# Patient Record
Sex: Female | Born: 1965
Health system: Southern US, Community
[De-identification: ages and names within clinical notes are randomized; demographics above are authoritative.]

## PROBLEM LIST (undated history)

## (undated) DIAGNOSIS — Z86018 Personal history of other benign neoplasm: Secondary | ICD-10-CM

## (undated) DIAGNOSIS — K219 Gastro-esophageal reflux disease without esophagitis: Secondary | ICD-10-CM

## (undated) DIAGNOSIS — Z9889 Other specified postprocedural states: Secondary | ICD-10-CM

## (undated) DIAGNOSIS — R102 Pelvic and perineal pain: Secondary | ICD-10-CM

## (undated) DIAGNOSIS — N83209 Unspecified ovarian cyst, unspecified side: Secondary | ICD-10-CM

## (undated) DIAGNOSIS — R112 Nausea with vomiting, unspecified: Secondary | ICD-10-CM

## (undated) HISTORY — PX: BREAST LUMPECTOMY: SHX2

---

## 1996-11-03 HISTORY — PX: COMBINED HYSTEROSCOPY DIAGNOSTIC / D&C: SUR297

## 1997-11-03 HISTORY — PX: ABDOMINAL HYSTERECTOMY: SHX81

## 1997-12-13 ENCOUNTER — Inpatient Hospital Stay (HOSPITAL_COMMUNITY): Admission: AD | Admit: 1997-12-13 | Discharge: 1997-12-13 | Payer: Self-pay | Admitting: Gynecology

## 1998-01-10 ENCOUNTER — Inpatient Hospital Stay (HOSPITAL_COMMUNITY): Admission: RE | Admit: 1998-01-10 | Discharge: 1998-01-10 | Payer: Self-pay | Admitting: Gynecology

## 1998-01-18 ENCOUNTER — Ambulatory Visit (HOSPITAL_COMMUNITY): Admission: RE | Admit: 1998-01-18 | Discharge: 1998-01-18 | Payer: Self-pay | Admitting: Gynecology

## 1998-02-10 ENCOUNTER — Inpatient Hospital Stay (HOSPITAL_COMMUNITY): Admission: AD | Admit: 1998-02-10 | Discharge: 1998-02-10 | Payer: Self-pay | Admitting: Gynecology

## 1998-03-14 ENCOUNTER — Inpatient Hospital Stay (HOSPITAL_COMMUNITY): Admission: AD | Admit: 1998-03-14 | Discharge: 1998-03-14 | Payer: Self-pay | Admitting: Gynecology

## 2001-08-25 ENCOUNTER — Other Ambulatory Visit: Admission: RE | Admit: 2001-08-25 | Discharge: 2001-08-25 | Payer: Self-pay | Admitting: Gynecology

## 2001-12-09 ENCOUNTER — Encounter: Payer: Self-pay | Admitting: Internal Medicine

## 2001-12-09 ENCOUNTER — Encounter: Admission: RE | Admit: 2001-12-09 | Discharge: 2001-12-09 | Payer: Self-pay | Admitting: Internal Medicine

## 2002-09-05 ENCOUNTER — Other Ambulatory Visit: Admission: RE | Admit: 2002-09-05 | Discharge: 2002-09-05 | Payer: Self-pay | Admitting: Gynecology

## 2004-03-20 ENCOUNTER — Other Ambulatory Visit: Admission: RE | Admit: 2004-03-20 | Discharge: 2004-03-20 | Payer: Self-pay | Admitting: Gynecology

## 2005-05-12 ENCOUNTER — Other Ambulatory Visit: Admission: RE | Admit: 2005-05-12 | Discharge: 2005-05-12 | Payer: Self-pay | Admitting: Gynecology

## 2006-09-03 ENCOUNTER — Other Ambulatory Visit: Admission: RE | Admit: 2006-09-03 | Discharge: 2006-09-03 | Payer: Self-pay | Admitting: Gynecology

## 2008-04-12 ENCOUNTER — Other Ambulatory Visit: Admission: RE | Admit: 2008-04-12 | Discharge: 2008-04-12 | Payer: Self-pay | Admitting: Gynecology

## 2008-06-09 ENCOUNTER — Encounter: Admission: RE | Admit: 2008-06-09 | Discharge: 2008-06-09 | Payer: Self-pay | Admitting: Orthopedic Surgery

## 2009-08-18 ENCOUNTER — Emergency Department (HOSPITAL_COMMUNITY): Admission: EM | Admit: 2009-08-18 | Discharge: 2009-08-18 | Payer: Self-pay | Admitting: Family Medicine

## 2009-08-21 ENCOUNTER — Encounter: Admission: RE | Admit: 2009-08-21 | Discharge: 2009-08-21 | Payer: Self-pay | Admitting: Internal Medicine

## 2009-08-22 IMAGING — RF DG FLUORO GUIDE NDL PLC/BX
2 series · 2 of 2 positions shown · IV contrast (magnevist)
Comparison: none

CLINICAL DATA: Pain

Fluoroscopy Time: 0.22 minutes.
RIGHT HIP INJECTION FOR MRI
TECHNIQUE: Overlying skin prepped with Betadine, draped in the
usual sterile fashion, and infiltrated locally with Lidocaine.
Curved 22 gauge spinal needle advanced to the superolateral margin
of the right femoral head.  1 ml of Lidocaine injected easily.  A
mixture of 0.1 ml Magnevist in 10 ml of dilute Ronlor 60 was then
used to opacify the right femoral head.  No immediate complication.

[Series 1: (hospital) · 1 of 1 slices shown (1 of 2)]
[im 1/1]
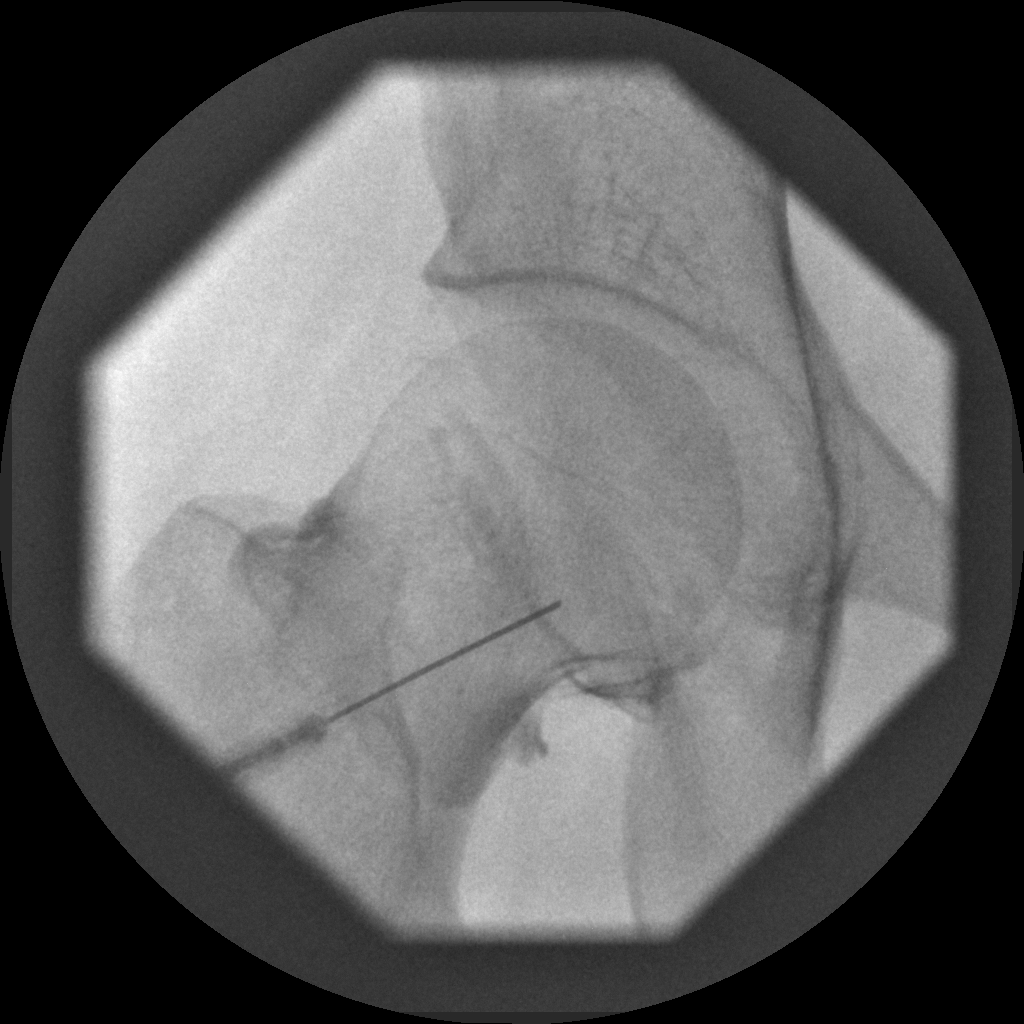

[Series 2: (hospital) · 1 of 1 slices shown (2 of 2)]
[im 1/1]
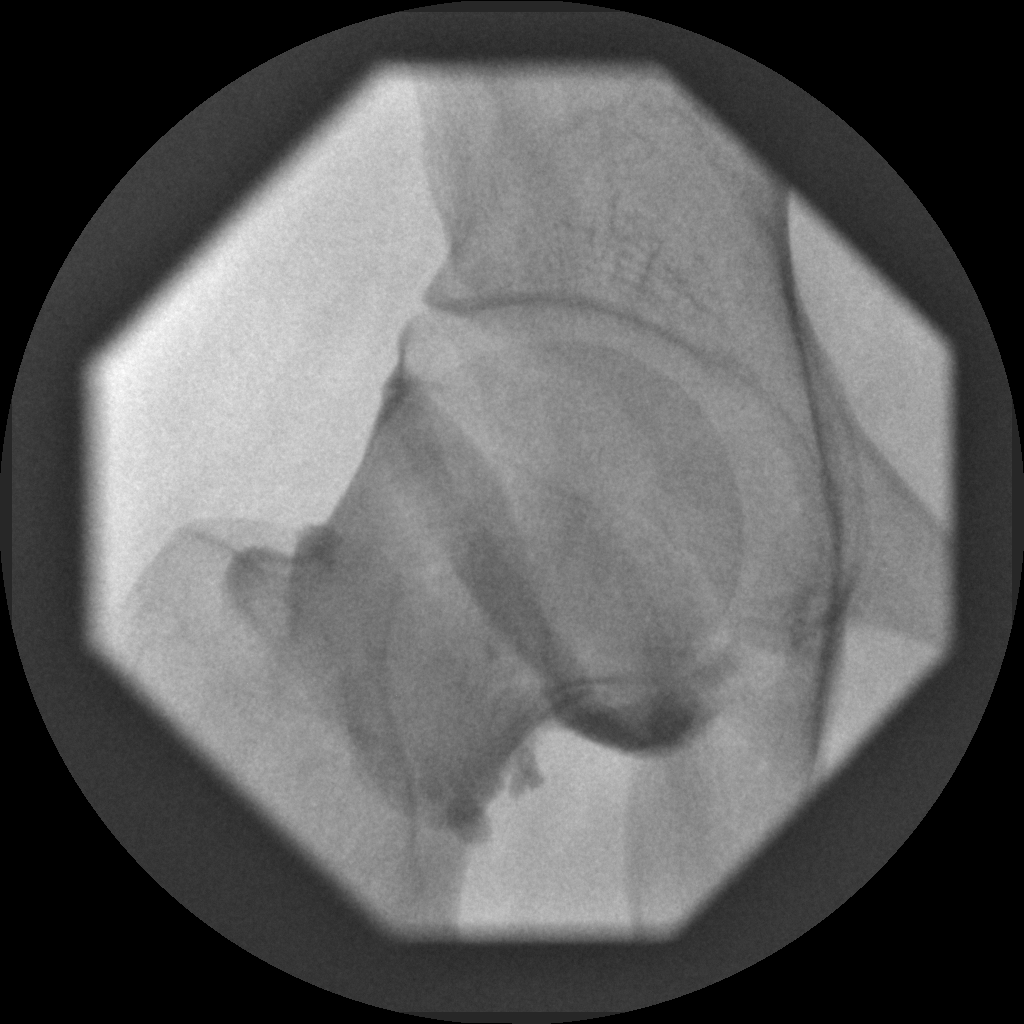

[2 of 2 positions shown; findings below may reference images not displayed]

IMPRESSION: Technically successful right hip injection for MRI.

## 2011-07-31 ENCOUNTER — Ambulatory Visit (HOSPITAL_BASED_OUTPATIENT_CLINIC_OR_DEPARTMENT_OTHER)
Admission: RE | Admit: 2011-07-31 | Discharge: 2011-07-31 | Disposition: A | Discharge status: Home / Self Care | Payer: BC Managed Care – PPO | Source: Ambulatory Visit | Attending: Podiatry | Admitting: Podiatry

## 2011-07-31 DIAGNOSIS — Z01812 Encounter for preprocedural laboratory examination: Secondary | ICD-10-CM | POA: Insufficient documentation

## 2011-07-31 DIAGNOSIS — M722 Plantar fascial fibromatosis: Secondary | ICD-10-CM | POA: Insufficient documentation

## 2011-07-31 LAB — POCT HEMOGLOBIN-HEMACUE: Hemoglobin: 12.4 g/dL (ref 12.0–15.0)

## 2012-01-06 ENCOUNTER — Other Ambulatory Visit: Payer: Self-pay | Admitting: Internal Medicine

## 2012-01-06 DIAGNOSIS — E079 Disorder of thyroid, unspecified: Secondary | ICD-10-CM

## 2012-02-03 ENCOUNTER — Other Ambulatory Visit: Payer: BC Managed Care – PPO

## 2013-08-05 ENCOUNTER — Other Ambulatory Visit: Payer: Self-pay | Admitting: Gynecology

## 2013-08-05 DIAGNOSIS — R922 Inconclusive mammogram: Secondary | ICD-10-CM

## 2013-10-14 ENCOUNTER — Other Ambulatory Visit: Payer: Self-pay | Admitting: Gynecology

## 2013-10-14 DIAGNOSIS — N6320 Unspecified lump in the left breast, unspecified quadrant: Secondary | ICD-10-CM

## 2013-10-24 ENCOUNTER — Other Ambulatory Visit: Payer: BC Managed Care – PPO

## 2016-05-12 DIAGNOSIS — Z1231 Encounter for screening mammogram for malignant neoplasm of breast: Secondary | ICD-10-CM | POA: Diagnosis not present

## 2016-05-12 DIAGNOSIS — Z803 Family history of malignant neoplasm of breast: Secondary | ICD-10-CM | POA: Diagnosis not present

## 2016-06-19 DIAGNOSIS — Z01419 Encounter for gynecological examination (general) (routine) without abnormal findings: Secondary | ICD-10-CM | POA: Diagnosis not present

## 2016-06-19 DIAGNOSIS — Z6833 Body mass index (BMI) 33.0-33.9, adult: Secondary | ICD-10-CM | POA: Diagnosis not present

## 2016-08-18 DIAGNOSIS — E559 Vitamin D deficiency, unspecified: Secondary | ICD-10-CM | POA: Diagnosis not present

## 2016-08-18 DIAGNOSIS — E7800 Pure hypercholesterolemia, unspecified: Secondary | ICD-10-CM | POA: Diagnosis not present

## 2016-08-18 DIAGNOSIS — Z0000 Encounter for general adult medical examination without abnormal findings: Secondary | ICD-10-CM | POA: Diagnosis not present

## 2016-09-09 DIAGNOSIS — Z0000 Encounter for general adult medical examination without abnormal findings: Secondary | ICD-10-CM | POA: Diagnosis not present

## 2016-09-09 DIAGNOSIS — Z23 Encounter for immunization: Secondary | ICD-10-CM | POA: Diagnosis not present

## 2016-09-09 DIAGNOSIS — K140 Glossitis: Secondary | ICD-10-CM | POA: Diagnosis not present

## 2016-09-09 DIAGNOSIS — Z833 Family history of diabetes mellitus: Secondary | ICD-10-CM | POA: Diagnosis not present

## 2016-09-10 ENCOUNTER — Other Ambulatory Visit: Payer: Self-pay | Admitting: Internal Medicine

## 2016-09-10 DIAGNOSIS — E010 Iodine-deficiency related diffuse (endemic) goiter: Secondary | ICD-10-CM

## 2016-09-15 DIAGNOSIS — D225 Melanocytic nevi of trunk: Secondary | ICD-10-CM | POA: Diagnosis not present

## 2016-09-15 DIAGNOSIS — L918 Other hypertrophic disorders of the skin: Secondary | ICD-10-CM | POA: Diagnosis not present

## 2016-09-15 DIAGNOSIS — D485 Neoplasm of uncertain behavior of skin: Secondary | ICD-10-CM | POA: Diagnosis not present

## 2016-09-15 DIAGNOSIS — D1801 Hemangioma of skin and subcutaneous tissue: Secondary | ICD-10-CM | POA: Diagnosis not present

## 2016-09-15 DIAGNOSIS — L821 Other seborrheic keratosis: Secondary | ICD-10-CM | POA: Diagnosis not present

## 2016-10-09 ENCOUNTER — Encounter: Payer: Self-pay | Admitting: Internal Medicine

## 2016-11-24 ENCOUNTER — Other Ambulatory Visit: Payer: Self-pay

## 2016-11-28 ENCOUNTER — Ambulatory Visit
Admission: RE | Admit: 2016-11-28 | Discharge: 2016-11-28 | Disposition: A | Discharge status: Home / Self Care | Payer: BLUE CROSS/BLUE SHIELD | Source: Ambulatory Visit | Attending: Internal Medicine | Admitting: Internal Medicine

## 2016-11-28 DIAGNOSIS — E079 Disorder of thyroid, unspecified: Secondary | ICD-10-CM | POA: Diagnosis not present

## 2016-11-28 DIAGNOSIS — E010 Iodine-deficiency related diffuse (endemic) goiter: Secondary | ICD-10-CM

## 2017-03-04 DIAGNOSIS — N6310 Unspecified lump in the right breast, unspecified quadrant: Secondary | ICD-10-CM | POA: Diagnosis not present

## 2017-03-05 DIAGNOSIS — N6310 Unspecified lump in the right breast, unspecified quadrant: Secondary | ICD-10-CM | POA: Diagnosis not present

## 2017-03-17 DIAGNOSIS — R05 Cough: Secondary | ICD-10-CM | POA: Diagnosis not present

## 2017-04-02 DIAGNOSIS — N951 Menopausal and female climacteric states: Secondary | ICD-10-CM | POA: Diagnosis not present

## 2017-04-02 DIAGNOSIS — N6310 Unspecified lump in the right breast, unspecified quadrant: Secondary | ICD-10-CM | POA: Diagnosis not present

## 2017-04-30 DIAGNOSIS — R05 Cough: Secondary | ICD-10-CM | POA: Diagnosis not present

## 2017-08-21 DIAGNOSIS — M25561 Pain in right knee: Secondary | ICD-10-CM | POA: Diagnosis not present

## 2017-09-15 DIAGNOSIS — Z0000 Encounter for general adult medical examination without abnormal findings: Secondary | ICD-10-CM | POA: Diagnosis not present

## 2017-09-21 DIAGNOSIS — D1801 Hemangioma of skin and subcutaneous tissue: Secondary | ICD-10-CM | POA: Diagnosis not present

## 2017-09-21 DIAGNOSIS — L919 Hypertrophic disorder of the skin, unspecified: Secondary | ICD-10-CM | POA: Diagnosis not present

## 2017-09-21 DIAGNOSIS — L820 Inflamed seborrheic keratosis: Secondary | ICD-10-CM | POA: Diagnosis not present

## 2017-09-21 DIAGNOSIS — D2339 Other benign neoplasm of skin of other parts of face: Secondary | ICD-10-CM | POA: Diagnosis not present

## 2017-09-21 DIAGNOSIS — D225 Melanocytic nevi of trunk: Secondary | ICD-10-CM | POA: Diagnosis not present

## 2017-09-22 DIAGNOSIS — K219 Gastro-esophageal reflux disease without esophagitis: Secondary | ICD-10-CM | POA: Diagnosis not present

## 2017-09-22 DIAGNOSIS — Z0000 Encounter for general adult medical examination without abnormal findings: Secondary | ICD-10-CM | POA: Diagnosis not present

## 2017-09-22 DIAGNOSIS — F429 Obsessive-compulsive disorder, unspecified: Secondary | ICD-10-CM | POA: Diagnosis not present

## 2017-10-05 DIAGNOSIS — Z01419 Encounter for gynecological examination (general) (routine) without abnormal findings: Secondary | ICD-10-CM | POA: Diagnosis not present

## 2017-10-05 DIAGNOSIS — Z6831 Body mass index (BMI) 31.0-31.9, adult: Secondary | ICD-10-CM | POA: Diagnosis not present

## 2017-10-05 DIAGNOSIS — Z13820 Encounter for screening for osteoporosis: Secondary | ICD-10-CM | POA: Diagnosis not present

## 2017-12-22 DIAGNOSIS — R1032 Left lower quadrant pain: Secondary | ICD-10-CM | POA: Diagnosis not present

## 2017-12-22 DIAGNOSIS — R102 Pelvic and perineal pain: Secondary | ICD-10-CM | POA: Diagnosis not present

## 2017-12-25 DIAGNOSIS — R102 Pelvic and perineal pain: Secondary | ICD-10-CM | POA: Diagnosis not present

## 2017-12-25 DIAGNOSIS — N83202 Unspecified ovarian cyst, left side: Secondary | ICD-10-CM | POA: Diagnosis not present

## 2017-12-25 DIAGNOSIS — N9489 Other specified conditions associated with female genital organs and menstrual cycle: Secondary | ICD-10-CM | POA: Diagnosis not present

## 2018-01-01 DIAGNOSIS — N83209 Unspecified ovarian cyst, unspecified side: Secondary | ICD-10-CM | POA: Diagnosis not present

## 2018-02-05 DIAGNOSIS — N83202 Unspecified ovarian cyst, left side: Secondary | ICD-10-CM | POA: Diagnosis not present

## 2018-03-16 DIAGNOSIS — N83299 Other ovarian cyst, unspecified side: Secondary | ICD-10-CM | POA: Diagnosis not present

## 2018-03-16 DIAGNOSIS — R1032 Left lower quadrant pain: Secondary | ICD-10-CM | POA: Diagnosis not present

## 2018-04-19 DIAGNOSIS — D2372 Other benign neoplasm of skin of left lower limb, including hip: Secondary | ICD-10-CM | POA: Diagnosis not present

## 2018-04-19 DIAGNOSIS — B070 Plantar wart: Secondary | ICD-10-CM | POA: Diagnosis not present

## 2018-04-19 DIAGNOSIS — L821 Other seborrheic keratosis: Secondary | ICD-10-CM | POA: Diagnosis not present

## 2018-05-21 ENCOUNTER — Encounter (HOSPITAL_BASED_OUTPATIENT_CLINIC_OR_DEPARTMENT_OTHER): Payer: Self-pay | Admitting: *Deleted

## 2018-05-21 NOTE — Progress Notes (Signed)
Called and lvm via phone w/ heather, or scheduler for dr leger. Asked her to have dr leger re-do her informed consent due to abbreviation's the she wrote for procedure.

## 2018-05-24 ENCOUNTER — Encounter (HOSPITAL_BASED_OUTPATIENT_CLINIC_OR_DEPARTMENT_OTHER): Payer: Self-pay | Admitting: *Deleted

## 2018-05-24 ENCOUNTER — Other Ambulatory Visit: Payer: Self-pay

## 2018-05-24 DIAGNOSIS — N83202 Unspecified ovarian cyst, left side: Secondary | ICD-10-CM | POA: Diagnosis not present

## 2018-05-24 DIAGNOSIS — R1032 Left lower quadrant pain: Secondary | ICD-10-CM | POA: Diagnosis not present

## 2018-05-24 NOTE — Progress Notes (Signed)
Spoke w/ pt via phone for pre-op interview.  Npo after mn.  Arrive at 0530. Needs cbc and t&s.

## 2018-05-24 NOTE — H&P (Addendum)
Destiny Baker is an 52 y.o. female. Presenting for scheduled LSC BSO s/s L ovarian cyst. Cyst measures 5cm and has been stable in size for several months. CA125 has been WNL.   Pertinent Gynecological History: Menses: post-menopausal Bleeding: na Contraception: status post hysterectomy DES exposure: denies Blood transfusions: none Sexually transmitted diseases: no past history Previous GYN Procedures: na  Last mammogram: normal Date: 2018 Last pap: normal OB History: G0   Menstrual History: No LMP recorded. Patient has had a hysterectomy.    Past Medical History:  Diagnosis Date  . GERD (gastroesophageal reflux disease)   . History of uterine fibroid   . Ovarian cyst   . Pelvic pain   . PONV (postoperative nausea and vomiting)   depression - fluoxetine QD  Past Surgical History:  Procedure Laterality Date  . ABDOMINAL HYSTERECTOMY  1999  . BREAST LUMPECTOMY Left 1990s   benign  . COMBINED HYSTEROSCOPY DIAGNOSTIC / D&C  1998    History reviewed. No pertinent family history.  Social History:  reports that she has never smoked. She has never used smokeless tobacco. She reports that she does not drink alcohol or use drugs.  Allergies: No Known Allergies  No medications prior to admission.    ROS  Height 5\' 6"  (1.676 m), weight 95.3 kg (210 lb). Physical Exam  Gen: well appearing, NAD CV: Reg rate Pulm: NWOB Abd: soft, nondistended, nontender, no masses GYN: uterus absent, no adnexa ttp/CMT Ext: No edema b/l   No results found for this or any previous visit (from the past 24 hour(s)).  No results found.  Assessment/Plan: 52 yo here for [facility] BSO s/s L ovarian cyst measuring 5cm. She has intermittent LLQ pain. The cyst has been stable in size for several months. CA125 has been WNL. However, patient requests removal. Risks reviewed in office - infxn, bleeding, damage to surrounding structures, and need for additional procedures. If unable to removal L ovary LSC,  would plan mini-lap. If unable to removal R ovary laproscopically, prefers that it stay. Otherwise, if able to do [facility], prefers removal of both ovaries and tubes if possible.   Tyson Dense 05/24/2018, 1:56 PM   No updates to above H&P. Patient arrived NPO and was consented in PACU. Risks discussed including infection, bleeding, damage to surrounding structures, need for additional procedures, and above discussion. All questions answered. Consent signed. Proceed with above surgery.   Lucillie Garfinkel MD

## 2018-05-25 ENCOUNTER — Encounter (HOSPITAL_BASED_OUTPATIENT_CLINIC_OR_DEPARTMENT_OTHER): Payer: Self-pay | Admitting: Anesthesiology

## 2018-05-25 NOTE — Anesthesia Preprocedure Evaluation (Addendum)
Anesthesia Evaluation  Patient identified by MRN, date of birth, ID band Patient awake    Reviewed: Allergy & Precautions, NPO status , Patient's Chart, lab work & pertinent test results  History of Anesthesia Complications (+) PONV and history of anesthetic complications  Airway Mallampati: II  TM Distance: >3 FB Neck ROM: Full    Dental  (+) Teeth Intact   Pulmonary neg pulmonary ROS,    Pulmonary exam normal breath sounds clear to auscultation       Cardiovascular negative cardio ROS Normal cardiovascular exam Rhythm:Regular Rate:Normal     Neuro/Psych Depression negative neurological ROS     GI/Hepatic Neg liver ROS, GERD  Medicated and Controlled,  Endo/Other  Obesity  Renal/GU negative Renal ROS  negative genitourinary   Musculoskeletal   Abdominal (+) + obese,   Peds  Hematology negative hematology ROS (+)   Anesthesia Other Findings   Reproductive/Obstetrics LLQ pelvic pain Left ovarian cysts Uterine fibroids                            Anesthesia Physical Anesthesia Plan  ASA: II  Anesthesia Plan: General   Post-op Pain Management:    Induction: Intravenous  PONV Risk Score and Plan: 4 or greater and Scopolamine patch - Pre-op, Midazolam, Dexamethasone, Ondansetron and Treatment may vary due to age or medical condition  Airway Management Planned: Oral ETT  Additional Equipment:   Intra-op Plan:   Post-operative Plan: Extubation in OR  Informed Consent: I have reviewed the patients History and Physical, chart, labs and discussed the procedure including the risks, benefits and alternatives for the proposed anesthesia with the patient or authorized representative who has indicated his/her understanding and acceptance.   Dental advisory given  Plan Discussed with: CRNA and Surgeon  Anesthesia Plan Comments:        Anesthesia Quick Evaluation

## 2018-05-26 ENCOUNTER — Encounter (HOSPITAL_BASED_OUTPATIENT_CLINIC_OR_DEPARTMENT_OTHER)
Admission: RE | Disposition: A | Discharge status: Home / Self Care | Payer: Self-pay | Source: Ambulatory Visit | Attending: Obstetrics and Gynecology

## 2018-05-26 ENCOUNTER — Ambulatory Visit (HOSPITAL_BASED_OUTPATIENT_CLINIC_OR_DEPARTMENT_OTHER): Payer: BLUE CROSS/BLUE SHIELD | Admitting: Certified Registered"

## 2018-05-26 ENCOUNTER — Ambulatory Visit (HOSPITAL_COMMUNITY)
Admission: RE | Admit: 2018-05-26 | Discharge: 2018-05-26 | Disposition: A | Discharge status: Home / Self Care | Payer: BLUE CROSS/BLUE SHIELD | Source: Ambulatory Visit | Attending: Obstetrics and Gynecology | Admitting: Obstetrics and Gynecology

## 2018-05-26 ENCOUNTER — Encounter (HOSPITAL_BASED_OUTPATIENT_CLINIC_OR_DEPARTMENT_OTHER): Payer: Self-pay | Admitting: *Deleted

## 2018-05-26 DIAGNOSIS — D279 Benign neoplasm of unspecified ovary: Secondary | ICD-10-CM | POA: Diagnosis not present

## 2018-05-26 DIAGNOSIS — F329 Major depressive disorder, single episode, unspecified: Secondary | ICD-10-CM | POA: Diagnosis not present

## 2018-05-26 DIAGNOSIS — Z79899 Other long term (current) drug therapy: Secondary | ICD-10-CM | POA: Insufficient documentation

## 2018-05-26 DIAGNOSIS — K219 Gastro-esophageal reflux disease without esophagitis: Secondary | ICD-10-CM | POA: Insufficient documentation

## 2018-05-26 DIAGNOSIS — N83201 Unspecified ovarian cyst, right side: Secondary | ICD-10-CM | POA: Diagnosis not present

## 2018-05-26 DIAGNOSIS — D271 Benign neoplasm of left ovary: Secondary | ICD-10-CM | POA: Diagnosis not present

## 2018-05-26 DIAGNOSIS — N83202 Unspecified ovarian cyst, left side: Secondary | ICD-10-CM | POA: Diagnosis not present

## 2018-05-26 HISTORY — PX: LAPAROSCOPIC BILATERAL SALPINGO OOPHERECTOMY: SHX5890

## 2018-05-26 HISTORY — DX: Pelvic and perineal pain: R10.2

## 2018-05-26 HISTORY — DX: Nausea with vomiting, unspecified: R11.2

## 2018-05-26 HISTORY — DX: Nausea with vomiting, unspecified: Z98.890

## 2018-05-26 HISTORY — DX: Personal history of other benign neoplasm: Z86.018

## 2018-05-26 HISTORY — DX: Gastro-esophageal reflux disease without esophagitis: K21.9

## 2018-05-26 HISTORY — DX: Unspecified ovarian cyst, unspecified side: N83.209

## 2018-05-26 LAB — CBC
HCT: 43.7 % (ref 36.0–46.0)
Hemoglobin: 14.6 g/dL (ref 12.0–15.0)
MCH: 31 pg (ref 26.0–34.0)
MCHC: 33.4 g/dL (ref 30.0–36.0)
MCV: 92.8 fL (ref 78.0–100.0)
Platelets: 259 10*3/uL (ref 150–400)
RBC: 4.71 MIL/uL (ref 3.87–5.11)
RDW: 14.4 % (ref 11.5–15.5)
WBC: 8.8 10*3/uL (ref 4.0–10.5)

## 2018-05-26 LAB — TYPE AND SCREEN
ABO/RH(D): A POS
Antibody Screen: NEGATIVE

## 2018-05-26 LAB — ABO/RH: ABO/RH(D): A POS

## 2018-05-26 SURGERY — SALPINGO-OOPHORECTOMY, BILATERAL, LAPAROSCOPIC
Anesthesia: General | Laterality: Bilateral

## 2018-05-26 MED ORDER — LACTATED RINGERS IV SOLN
INTRAVENOUS | Status: DC
  Administered 2018-05-26 (×2): via INTRAVENOUS
  Filled 2018-05-26: qty 1000

## 2018-05-26 MED ORDER — PROPOFOL 10 MG/ML IV BOLUS
INTRAVENOUS | Status: AC
  Filled 2018-05-26: qty 20

## 2018-05-26 MED ORDER — KETOROLAC TROMETHAMINE 30 MG/ML IJ SOLN
INTRAMUSCULAR | Status: AC
  Filled 2018-05-26: qty 1

## 2018-05-26 MED ORDER — EPHEDRINE SULFATE-NACL 50-0.9 MG/10ML-% IV SOSY
PREFILLED_SYRINGE | INTRAVENOUS | Status: DC | PRN
  Administered 2018-05-26: 5 mg via INTRAVENOUS
  Administered 2018-05-26: 10 mg via INTRAVENOUS

## 2018-05-26 MED ORDER — FENTANYL CITRATE (PF) 250 MCG/5ML IJ SOLN
INTRAMUSCULAR | Status: AC
  Filled 2018-05-26: qty 5

## 2018-05-26 MED ORDER — DEXAMETHASONE SODIUM PHOSPHATE 10 MG/ML IJ SOLN
INTRAMUSCULAR | Status: AC
  Filled 2018-05-26: qty 1

## 2018-05-26 MED ORDER — ONDANSETRON HCL 4 MG/2ML IJ SOLN
INTRAMUSCULAR | Status: DC | PRN
  Administered 2018-05-26 (×2): 4 mg via INTRAVENOUS

## 2018-05-26 MED ORDER — ROCURONIUM BROMIDE 100 MG/10ML IV SOLN
INTRAVENOUS | Status: DC | PRN
  Administered 2018-05-26: 40 mg via INTRAVENOUS
  Administered 2018-05-26: 10 mg via INTRAVENOUS

## 2018-05-26 MED ORDER — LIDOCAINE 2% (20 MG/ML) 5 ML SYRINGE
INTRAMUSCULAR | Status: DC | PRN
  Administered 2018-05-26: 80 mg via INTRAVENOUS

## 2018-05-26 MED ORDER — HYDROCODONE-ACETAMINOPHEN 7.5-325 MG PO TABS
ORAL_TABLET | ORAL | Status: AC
  Filled 2018-05-26: qty 1

## 2018-05-26 MED ORDER — MIDAZOLAM HCL 5 MG/5ML IJ SOLN
INTRAMUSCULAR | Status: DC | PRN
  Administered 2018-05-26: 2 mg via INTRAVENOUS

## 2018-05-26 MED ORDER — METOCLOPRAMIDE HCL 5 MG/ML IJ SOLN
10.0000 mg | Freq: Once | INTRAMUSCULAR | Status: AC | PRN
  Administered 2018-05-26: 10 mg via INTRAVENOUS
  Filled 2018-05-26: qty 2

## 2018-05-26 MED ORDER — SUGAMMADEX SODIUM 200 MG/2ML IV SOLN
INTRAVENOUS | Status: DC | PRN
  Administered 2018-05-26: 150 mg via INTRAVENOUS

## 2018-05-26 MED ORDER — SODIUM CHLORIDE 0.9 % IR SOLN
Status: DC | PRN
  Administered 2018-05-26: 1000 mL

## 2018-05-26 MED ORDER — MEPERIDINE HCL 25 MG/ML IJ SOLN
6.2500 mg | INTRAMUSCULAR | Status: DC | PRN
  Filled 2018-05-26: qty 1

## 2018-05-26 MED ORDER — FENTANYL CITRATE (PF) 100 MCG/2ML IJ SOLN
INTRAMUSCULAR | Status: DC | PRN
  Administered 2018-05-26 (×3): 50 ug via INTRAVENOUS

## 2018-05-26 MED ORDER — METOCLOPRAMIDE HCL 5 MG/ML IJ SOLN
INTRAMUSCULAR | Status: AC
  Filled 2018-05-26: qty 2

## 2018-05-26 MED ORDER — HYDROCODONE-ACETAMINOPHEN 7.5-325 MG PO TABS
1.0000 | ORAL_TABLET | Freq: Once | ORAL | Status: AC | PRN
  Administered 2018-05-26: 1 via ORAL
  Filled 2018-05-26: qty 1

## 2018-05-26 MED ORDER — LIDOCAINE 2% (20 MG/ML) 5 ML SYRINGE
INTRAMUSCULAR | Status: AC
  Filled 2018-05-26: qty 5

## 2018-05-26 MED ORDER — ROCURONIUM BROMIDE 100 MG/10ML IV SOLN
INTRAVENOUS | Status: AC
  Filled 2018-05-26: qty 1

## 2018-05-26 MED ORDER — HYDROMORPHONE HCL 1 MG/ML IJ SOLN
0.2500 mg | INTRAMUSCULAR | Status: DC | PRN
  Filled 2018-05-26: qty 0.5

## 2018-05-26 MED ORDER — SCOPOLAMINE 1 MG/3DAYS TD PT72
MEDICATED_PATCH | TRANSDERMAL | Status: AC
  Filled 2018-05-26: qty 1

## 2018-05-26 MED ORDER — ONDANSETRON HCL 4 MG/2ML IJ SOLN
INTRAMUSCULAR | Status: AC
  Filled 2018-05-26: qty 4

## 2018-05-26 MED ORDER — SCOPOLAMINE 1 MG/3DAYS TD PT72
1.0000 | MEDICATED_PATCH | TRANSDERMAL | Status: DC
  Administered 2018-05-26: 1.5 mg via TRANSDERMAL
  Filled 2018-05-26: qty 1

## 2018-05-26 MED ORDER — KETOROLAC TROMETHAMINE 30 MG/ML IJ SOLN
INTRAMUSCULAR | Status: DC | PRN
  Administered 2018-05-26: 30 mg via INTRAVENOUS

## 2018-05-26 MED ORDER — DEXAMETHASONE SODIUM PHOSPHATE 10 MG/ML IJ SOLN
INTRAMUSCULAR | Status: DC | PRN
  Administered 2018-05-26: 10 mg via INTRAVENOUS

## 2018-05-26 MED ORDER — DIPHENHYDRAMINE HCL 50 MG/ML IJ SOLN
INTRAMUSCULAR | Status: AC
  Filled 2018-05-26: qty 1

## 2018-05-26 MED ORDER — PROPOFOL 10 MG/ML IV BOLUS
INTRAVENOUS | Status: DC | PRN
  Administered 2018-05-26: 30 mg via INTRAVENOUS
  Administered 2018-05-26: 170 mg via INTRAVENOUS

## 2018-05-26 MED ORDER — MIDAZOLAM HCL 2 MG/2ML IJ SOLN
INTRAMUSCULAR | Status: AC
  Filled 2018-05-26: qty 2

## 2018-05-26 MED ORDER — DIPHENHYDRAMINE HCL 50 MG/ML IJ SOLN
12.5000 mg | Freq: Once | INTRAMUSCULAR | Status: AC
  Administered 2018-05-26: 12.5 mg via INTRAVENOUS
  Filled 2018-05-26: qty 0.25

## 2018-05-26 MED ORDER — BUPIVACAINE HCL (PF) 0.25 % IJ SOLN
INTRAMUSCULAR | Status: DC | PRN
  Administered 2018-05-26: 7 mL

## 2018-05-26 SURGICAL SUPPLY — 37 items
ADH SKN CLS APL DERMABOND .7 (GAUZE/BANDAGES/DRESSINGS) ×1
CABLE HIGH FREQUENCY MONO STRZ (ELECTRODE) ×1 IMPLANT
CATH ROBINSON RED A/P 16FR (CATHETERS) ×2 IMPLANT
COVER MAYO STAND STRL (DRAPES) ×2 IMPLANT
DERMABOND ADVANCED (GAUZE/BANDAGES/DRESSINGS) ×1
DERMABOND ADVANCED .7 DNX12 (GAUZE/BANDAGES/DRESSINGS) ×1 IMPLANT
DRSG OPSITE POSTOP 3X4 (GAUZE/BANDAGES/DRESSINGS) IMPLANT
DURAPREP 26ML APPLICATOR (WOUND CARE) ×2 IMPLANT
GLOVE BIO SURGEON STRL SZ 6.5 (GLOVE) ×4 IMPLANT
GLOVE BIOGEL PI IND STRL 6.5 (GLOVE) ×1 IMPLANT
GLOVE BIOGEL PI INDICATOR 6.5 (GLOVE) ×1
GOWN STRL REUS W/TWL LRG LVL3 (GOWN DISPOSABLE) ×5 IMPLANT
IV NS 1000ML (IV SOLUTION)
IV NS 1000ML BAXH (IV SOLUTION) IMPLANT
LIGASURE BLUNT 5MM 37CM (INSTRUMENTS) ×1 IMPLANT
NDL HYPO 25X1 1.5 SAFETY (NEEDLE) ×1 IMPLANT
NDL INSUFFLATION 14GA 150MM (NEEDLE) IMPLANT
NEEDLE HYPO 25X1 1.5 SAFETY (NEEDLE) ×2 IMPLANT
NEEDLE INSUFFLATION 120MM (ENDOMECHANICALS) ×2 IMPLANT
NEEDLE INSUFFLATION 14GA 150MM (NEEDLE) ×2 IMPLANT
NS IRRIG 500ML POUR BTL (IV SOLUTION) ×2 IMPLANT
PACK LAPAROSCOPY BASIN (CUSTOM PROCEDURE TRAY) ×2 IMPLANT
POUCH LAPAROSCOPIC INSTRUMENT (MISCELLANEOUS) ×1 IMPLANT
SCISSORS LAP 5X35 DISP (ENDOMECHANICALS) IMPLANT
SCISSORS LAP 5X45 EPIX DISP (ENDOMECHANICALS) IMPLANT
SET IRRIG TUBING LAPAROSCOPIC (IRRIGATION / IRRIGATOR) ×1 IMPLANT
SUT VICRYL 0 UR6 27IN ABS (SUTURE) ×2 IMPLANT
SUT VICRYL RAPIDE 4/0 PS 2 (SUTURE) ×4 IMPLANT
SYR 50ML LL SCALE MARK (SYRINGE) IMPLANT
SYS BAG RETRIEVAL 10MM (BASKET) ×2
SYSTEM BAG RETRIEVAL 10MM (BASKET) IMPLANT
TOWEL OR 17X24 6PK STRL BLUE (TOWEL DISPOSABLE) ×4 IMPLANT
TROCAR XCEL NON-BLD 11X100MML (ENDOMECHANICALS) IMPLANT
TROCAR XCEL NON-BLD 5MMX100MML (ENDOMECHANICALS) ×3 IMPLANT
TUBING INSUF HEATED (TUBING) ×2 IMPLANT
WARMER LAPAROSCOPE (MISCELLANEOUS) ×2 IMPLANT
WATER STERILE IRR 500ML POUR (IV SOLUTION) ×2 IMPLANT

## 2018-05-26 NOTE — Discharge Instructions (Signed)
°Post Anesthesia Home Care Instructions ° °Activity: °Get plenty of rest for the remainder of the day. A responsible individual must stay with you for 24 hours following the procedure.  °For the next 24 hours, DO NOT: °-Drive a car °-Operate machinery °-Drink alcoholic beverages °-Take any medication unless instructed by your physician °-Make any legal decisions or sign important papers. ° °Meals: °Start with liquid foods such as gelatin or soup. Progress to regular foods as tolerated. Avoid greasy, spicy, heavy foods. If nausea and/or vomiting occur, drink only clear liquids until the nausea and/or vomiting subsides. Call your physician if vomiting continues. ° °Special Instructions/Symptoms: °Your throat may feel dry or sore from the anesthesia or the breathing tube placed in your throat during surgery. If this causes discomfort, gargle with warm salt water. The discomfort should disappear within 24 hours. ° °If you had a scopolamine patch placed behind your ear for the management of post- operative nausea and/or vomiting: ° °1. The medication in the patch is effective for 72 hours, after which it should be removed.  Wrap patch in a tissue and discard in the trash. Wash hands thoroughly with soap and water. °2. You may remove the patch earlier than 72 hours if you experience unpleasant side effects which may include dry mouth, dizziness or visual disturbances. °3. Avoid touching the patch. Wash your hands with soap and water after contact with the patch. °  °DISCHARGE INSTRUCTIONS: Laparoscopy ° °The following instructions have been prepared to help you care for yourself upon your return home today. ° °Wound care: °• Do not get the incision wet for the first 24 hours. The incision should be kept clean and dry. °• The Band-Aids or dressings may be removed the day after surgery. °• Should the incision become sore, red, and swollen after the first week, check with your doctor. ° °Personal hygiene: °• Shower the day  after your procedure. ° °Activity and limitations: °• Do NOT drive or operate any equipment today. °• Do NOT lift anything more than 15 pounds for 2-3 weeks after surgery. °• Do NOT rest in bed all day. °• Walking is encouraged. Walk each day, starting slowly with 5-minute walks 3 or 4 times a day. Slowly increase the length of your walks. °• Walk up and down stairs slowly. °• Do NOT do strenuous activities, such as golfing, playing tennis, bowling, running, biking, weight lifting, gardening, mowing, or vacuuming for 2-4 weeks. Ask your doctor when it is okay to start. ° °Diet: Eat a light meal as desired this evening. You may resume your usual diet tomorrow. ° °Return to work: This is dependent on the type of work you do. For the most part you can return to a desk job within a week of surgery. If you are more active at work, please discuss this with your doctor. ° °What to expect after your surgery: You may have a slight burning sensation when you urinate on the first day. You may have a very small amount of blood in the urine. Expect to have a small amount of vaginal discharge/light bleeding for 1-2 weeks. It is not unusual to have abdominal soreness and bruising for up to 2 weeks. You may be tired and need more rest for about 1 week. You may experience shoulder pain for 24-72 hours. Lying flat in bed may relieve it. ° °Call your doctor for any of the following: °• Develop a fever of 100.4 or greater °• Inability to urinate 6 hours after discharge   from hospital  Severe pain not relieved by pain medications  Persistent of heavy bleeding at incision site  Redness or swelling around incision site after a week  Increasing nausea or vomiting  Do not take any nonsteroidal anti inflammatories (Ibuprofen, Advil, Motrin, aleve) until after 3:00 pm today.

## 2018-05-26 NOTE — Transfer of Care (Signed)
Immediate Anesthesia Transfer of Care Note  Patient: Destiny Baker  Procedure(s) Performed: LAPAROSCOPIC BILATERAL SALPINGO OOPHORECTOMY (Bilateral )  Patient Location: PACU  Anesthesia Type:General  Level of Consciousness: awake and patient cooperative  Airway & Oxygen Therapy: Patient Spontanous Breathing and Patient connected to nasal cannula oxygen  Post-op Assessment: Report given to RN and Post -op Vital signs reviewed and stable  Post vital signs: Reviewed and stable  Last Vitals:  Vitals Value Taken Time  BP 125/75 05/26/2018  9:27 AM  Temp    Pulse 78 05/26/2018  9:29 AM  Resp 19 05/26/2018  9:29 AM  SpO2 93 % 05/26/2018  9:29 AM  Vitals shown include unvalidated device data.  Last Pain:  Vitals:   05/26/18 0531  TempSrc: Oral         Complications: No apparent anesthesia complications

## 2018-05-26 NOTE — Op Note (Signed)
  PREOPERATIVE DIAGNOSES: 1.Left ovarian cyst POSTOPERATIVE DIAGNOSES: 1. Same  PROCEDURE PERFORMED: Bilateral salpingoophorectomy  SURGEON: Dr. Lucillie Garfinkel ASSISTANT: None  ANESTHESIA: General   ESTIMATED BLOOD LOSS: 10cc.  URINE OUTPUT: 100 cc of clear urine  COMPLICATIONS: None   TUBES: None.  DRAINS: None  PATHOLOGY: bilateral ovaries and tubes, ovarian cyst  FINDINGS: On exam, under anesthesia, normal vagina.   Operative findings demonstrated an absent uterus. Normal right ovary and portion of fallopian tube. Left ovary with large ~5cm simple appearing cyst adherent to left fallopian tube and a portion of bowel. Bowel wrapped around superior portion \\The  bowel and omentum were normal appearing otherwise  Procedure: A general anesthesia was induced and the patient was placed in the dirsal lithotomy position. The abdomen, perineum, and vagina were prepped and draped in the usual fashion. The bladder was drained. Attention was then turned to the abdomen.   An infraumbilical incision was made and the Veress needle was gently advanced taking care to feel for the typical sensation of penetrating the peritoneum. With CO2 infiltration, an opening pressure of 5 mmHg was noted, and following this, a pneumoperitoneum of 15 mmHg was created. A 11 mm trocar was then passed through the same incision and the laparoscope was then inserted through the trocar sleeve.  Visualization of the peritoneal cavity was then obtained and a brief inspection did not reveal any signs of complications from entry. Under direct observation, 79mm suprapubic port was placed taking care to respect anatomical landmarks and vessels. The same was performed in the LLQ. Once the placement of the port was complete, the actual laparoscopic procedure began. Above operative findings noted.  Beginning on the right side, the Infundibular ligament was identified by lifting the tube towards the anterior wall of the abdomen. The  ureter was confirmed along the pelvic side wall and peristalsis was noted. The ligasure was then used to clamp and ligate the IP ligament in three sequential bites. The IP was then cut middistance, again being sure to be clear of the ureter. The fallopian tube was attached to the ovary and noted to be free from surrounding tissue and thus removed with the ovary at this time. Following this, attention was turned to the left side. The left ovary noted to have a large cyst attached to it as well as the fallopian tube. There was bowel wrapper around the superior portion of the mass. The bowel was carefully dissected off of the cyst with a combination or blunt and sharp dissection. The bowel was then thoroughly inspected and no injury or denuded portions were noted.  The ureter was confirmed along the pelvic side wall and peristalsis was noted. The ligasure was then used to clamp and ligate the IP ligament in three sequential bites. The IP was then cut middistance, again being sure to be clear of the ureter. The left fallopian tube was adherent to the side of the ovary and free from surrounding tissue and thus removed at that time.  The specimens were placed in a specimen bag and removed easily through 1mm port. The pelvis was irrigated and hemostasis was noted. All gas removed from abdomen and all instruments were removed. Fascia was closed with 0' vicryl and skin closed with 4'0 vicryl. The sponge and lap counts were correct times 2 at this time.   The patient's procedure was terminated. We then awakened her. She was sent to the Recovery Room in good condition.

## 2018-05-26 NOTE — Anesthesia Procedure Notes (Signed)
Procedure Name: Intubation Date/Time: 05/26/2018 7:29 AM Performed by: Gwyndolyn Saxon, CRNA Pre-anesthesia Checklist: Patient identified, Emergency Drugs available, Suction available, Patient being monitored and Timeout performed Patient Re-evaluated:Patient Re-evaluated prior to induction Oxygen Delivery Method: Circle system utilized Preoxygenation: Pre-oxygenation with 100% oxygen Induction Type: IV induction Ventilation: Mask ventilation without difficulty Laryngoscope Size: Miller and 2 Grade View: Grade I Tube type: Oral Tube size: 7.0 mm Number of attempts: 1 Placement Confirmation: ETT inserted through vocal cords under direct vision,  positive ETCO2,  CO2 detector and breath sounds checked- equal and bilateral Secured at: 21 cm Tube secured with: Tape Dental Injury: Teeth and Oropharynx as per pre-operative assessment

## 2018-05-26 NOTE — Brief Op Note (Signed)
05/26/2018  4:41 PM  PATIENT:  Destiny Baker  52 y.o. female  PRE-OPERATIVE DIAGNOSIS:  LLQ PAIN, OVARIAN CYSTS  POST-OPERATIVE DIAGNOSIS:  LLQ PAIN, OVARIAN CYSTS  PROCEDURE:  Procedure(s): LAPAROSCOPIC BILATERAL SALPINGO OOPHORECTOMY (Bilateral)  SURGEON:  Surgeon(s) and Role:    * Maxine Fredman, Colin Benton, MD - Primary  PHYSICIAN ASSISTANT:   ASSISTANTS: none   ANESTHESIA:   general  EBL:  10 mL   BLOOD ADMINISTERED:none  DRAINS: none   LOCAL MEDICATIONS USED:  MARCAINE    and Amount: 10 ml  SPECIMEN:  Source of Specimen:  L ovary/tube with cyst, r tube an dovary  DISPOSITION OF SPECIMEN:  PATHOLOGY  COUNTS:  YES  TOURNIQUET:  * No tourniquets in log *  DICTATION: .Note written in EPIC  PLAN OF CARE: Discharge to home after PACU  PATIENT DISPOSITION:  PACU - hemodynamically stable.   Delay start of Pharmacological VTE agent (>24hrs) due to surgical blood loss or risk of bleeding: not applicable

## 2018-05-26 NOTE — Anesthesia Postprocedure Evaluation (Signed)
Anesthesia Post Note  Patient: Destiny Baker  Procedure(s) Performed: LAPAROSCOPIC BILATERAL SALPINGO OOPHORECTOMY (Bilateral )     Patient location during evaluation: PACU Anesthesia Type: General Level of consciousness: awake and alert and oriented Pain management: pain level controlled Vital Signs Assessment: post-procedure vital signs reviewed and stable Respiratory status: spontaneous breathing, nonlabored ventilation and respiratory function stable Cardiovascular status: blood pressure returned to baseline and stable Postop Assessment: no apparent nausea or vomiting Anesthetic complications: no    Last Vitals:  Vitals:   05/26/18 0945 05/26/18 0952  BP: 128/72   Pulse: 84 83  Resp: 11 18  Temp:    SpO2: 94% 96%    Last Pain:  Vitals:   05/26/18 0945  TempSrc:   PainSc: 3                  Laticia Vannostrand A.

## 2018-05-27 ENCOUNTER — Encounter (HOSPITAL_BASED_OUTPATIENT_CLINIC_OR_DEPARTMENT_OTHER): Payer: Self-pay | Admitting: Obstetrics and Gynecology

## 2018-06-01 DIAGNOSIS — J029 Acute pharyngitis, unspecified: Secondary | ICD-10-CM | POA: Diagnosis not present

## 2018-06-01 DIAGNOSIS — H66001 Acute suppurative otitis media without spontaneous rupture of ear drum, right ear: Secondary | ICD-10-CM | POA: Diagnosis not present

## 2018-08-05 DIAGNOSIS — M1711 Unilateral primary osteoarthritis, right knee: Secondary | ICD-10-CM | POA: Diagnosis not present

## 2018-08-16 DIAGNOSIS — K620 Anal polyp: Secondary | ICD-10-CM | POA: Diagnosis not present

## 2018-08-16 DIAGNOSIS — K6289 Other specified diseases of anus and rectum: Secondary | ICD-10-CM | POA: Diagnosis not present

## 2018-08-16 DIAGNOSIS — K621 Rectal polyp: Secondary | ICD-10-CM | POA: Diagnosis not present

## 2018-08-16 DIAGNOSIS — D129 Benign neoplasm of anus and anal canal: Secondary | ICD-10-CM | POA: Diagnosis not present

## 2018-08-16 DIAGNOSIS — Z1211 Encounter for screening for malignant neoplasm of colon: Secondary | ICD-10-CM | POA: Diagnosis not present

## 2018-09-04 IMAGING — US US THYROID
1 series · 13 of 25 positions shown · non-contrast
Comparison: None.

CLINICAL DATA: Palpable abnormality. Thyromegaly on physical
examination.

EXAM:
THYROID ULTRASOUND
TECHNIQUE: Ultrasound examination of the thyroid gland and adjacent soft
tissues was performed.

[Series 1: us thyroid · 0.05mm/px · 13 of 70 slices shown]
[im 1/70]
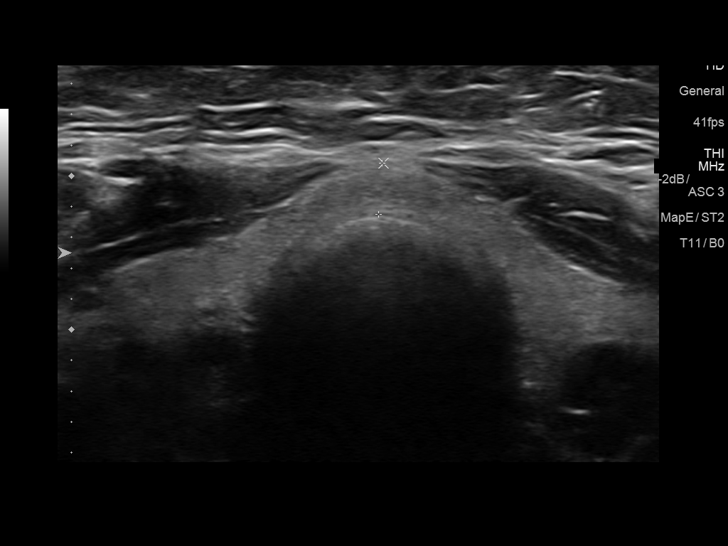
[im 6/70]
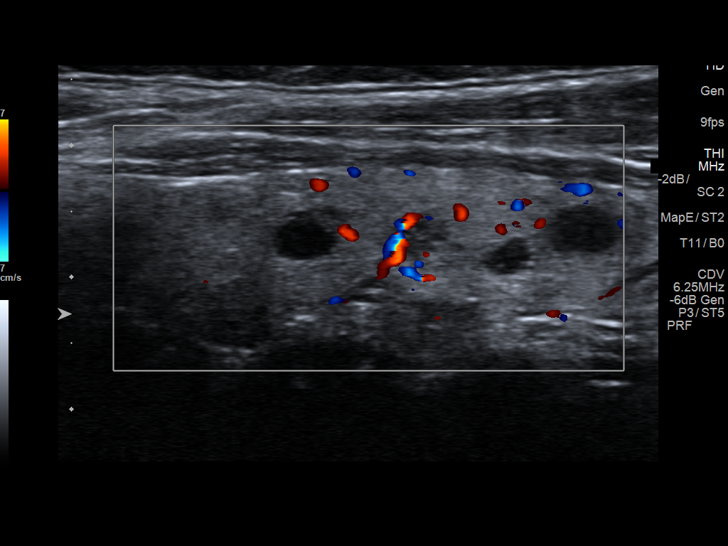
[im 12/70]
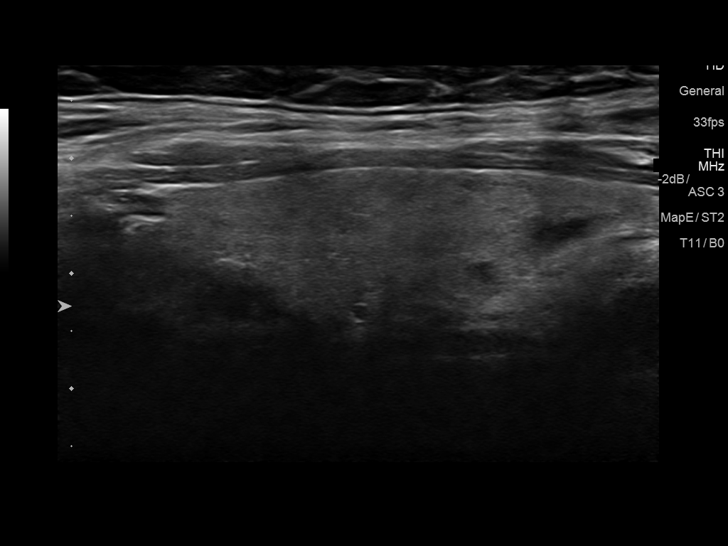
[im 18/70]
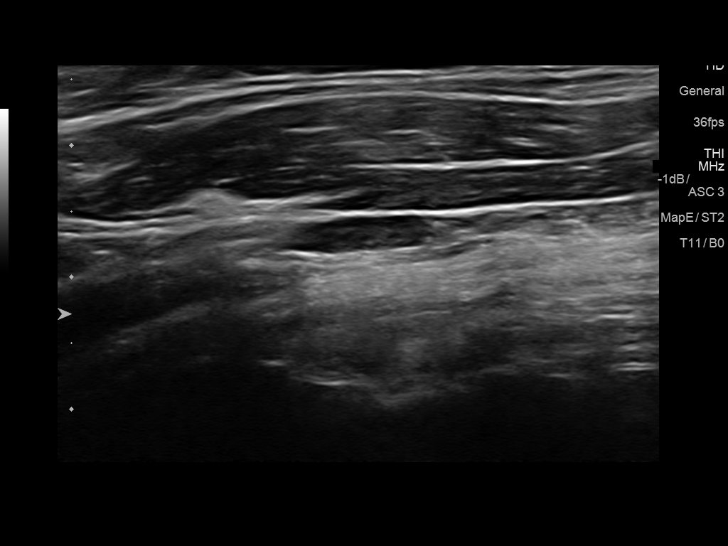
[im 24/70]
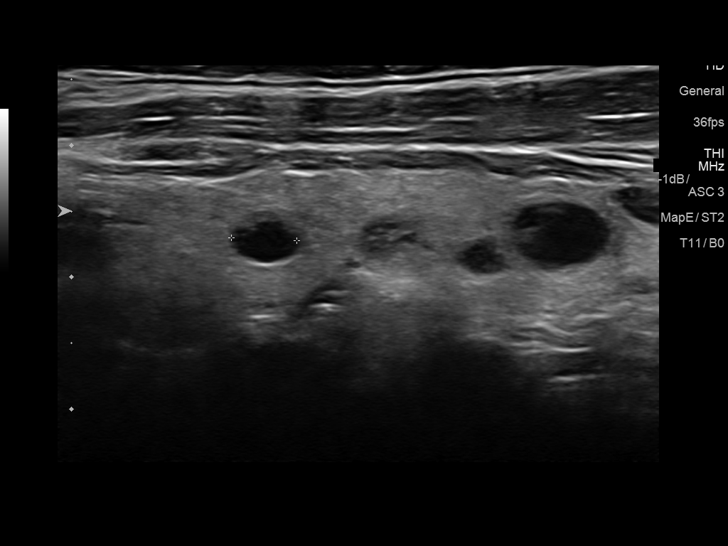
[im 29/70]
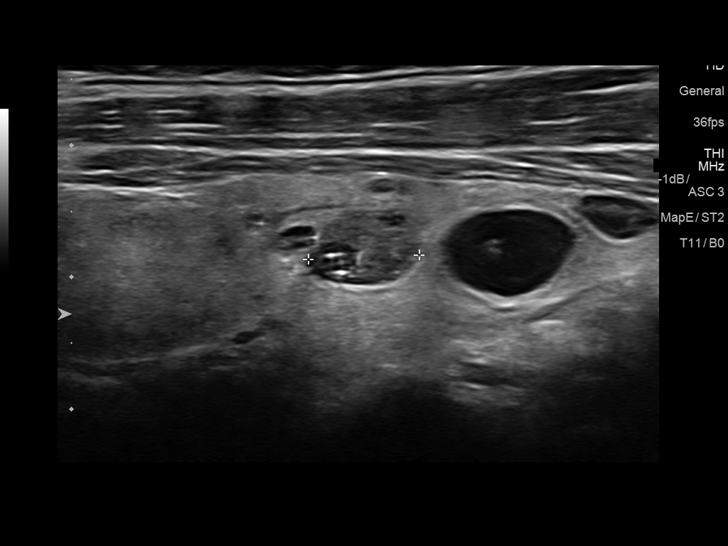
[im 35/70]
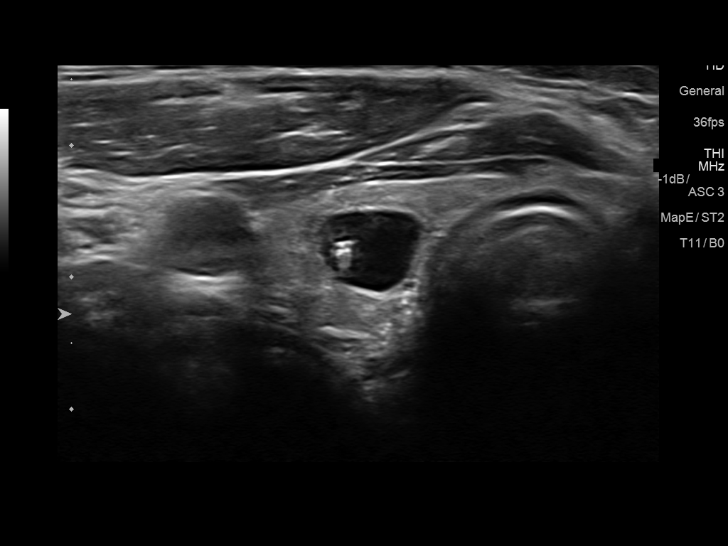
[im 41/70]
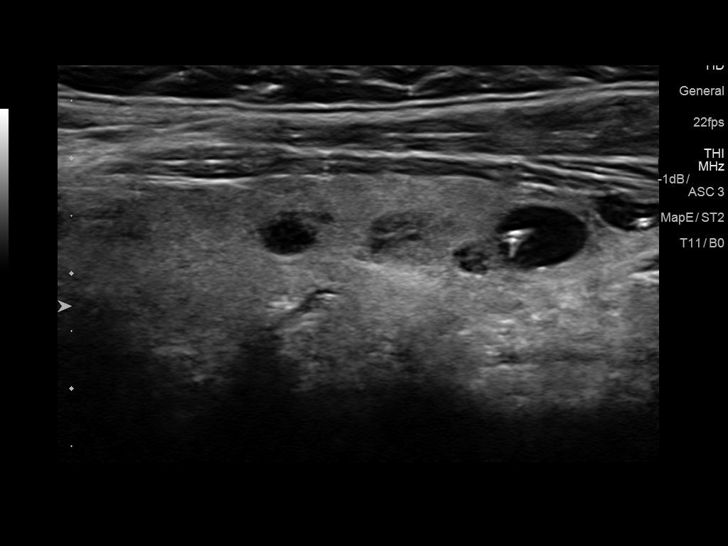
[im 47/70]
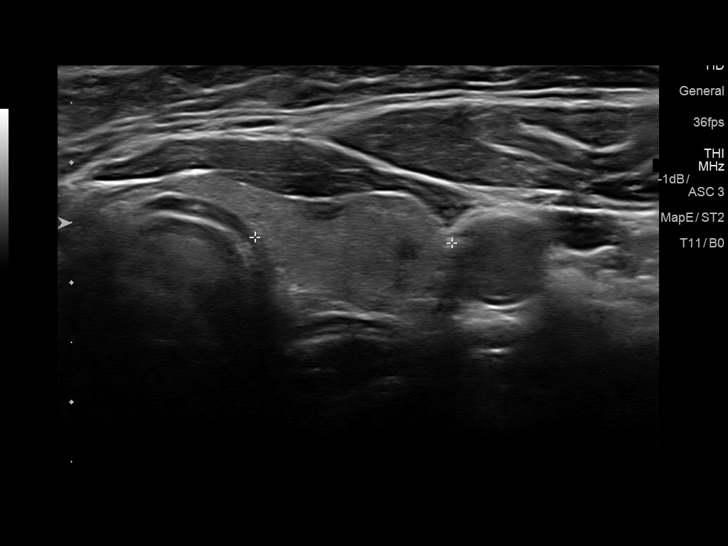
[im 52/70]
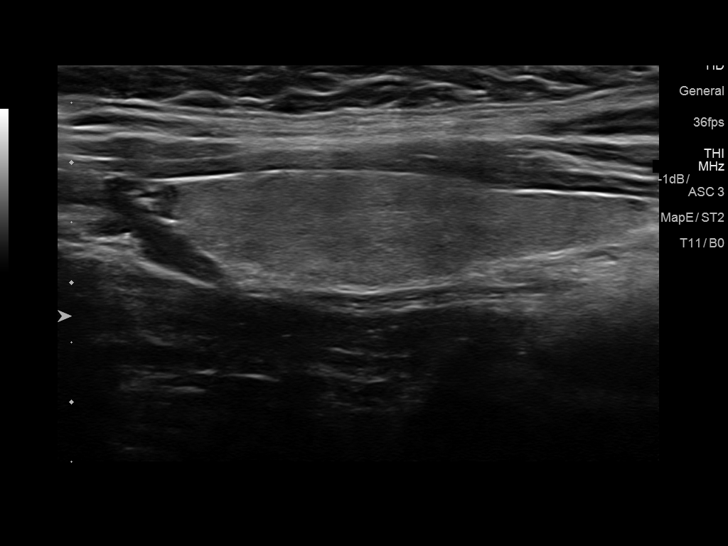
[im 58/70]
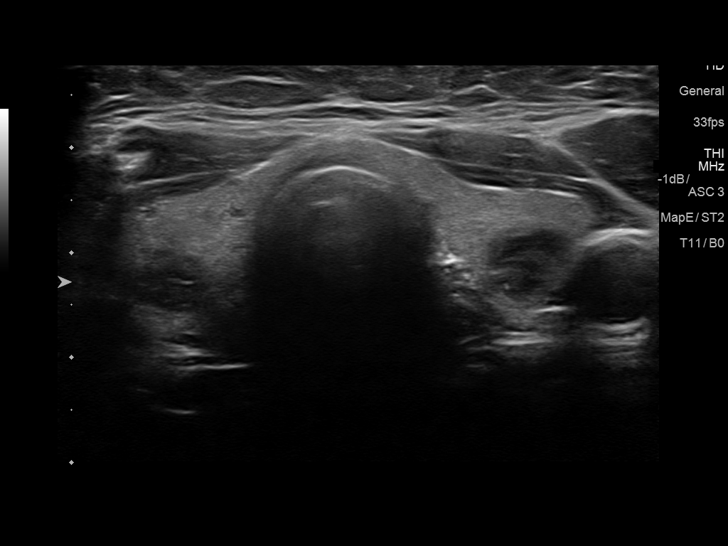
[im 64/70]
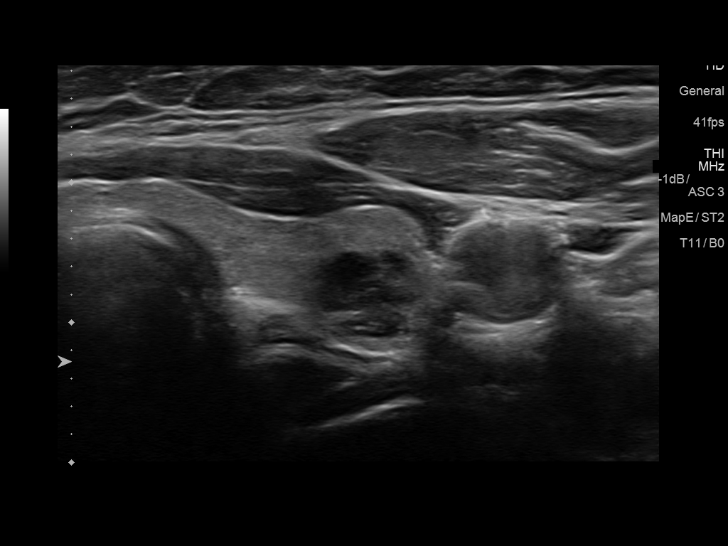
[im 70/70]
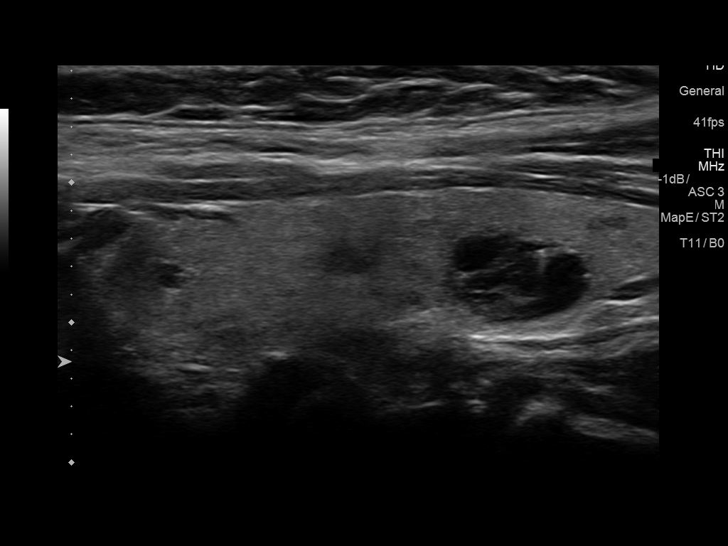

[13 of 25 positions shown; findings below may reference images not displayed]

FINDINGS: Parenchymal Echotexture: Mildly heterogenous

Isthmus: Normal in size measuring 0.3 cm in diameter

Right lobe: Normal in size measuring 4.5 x 1.5 x 1.6 cm

Left lobe: Normal in size measuring 5.1 x 1.6 x 1.6 cm

_________________________________________________________

Estimated total number of nodules >/= 1 cm: 2

Number of spongiform nodules >/=  2 cm not described below (TR1): 0

Number of mixed cystic and solid nodules >/= 1.5 cm not described
below (TR2): 0

Nodule # 1:

Location: Right; Mid

Maximum size: 0.8 cm; Other 2 dimensions: 0.8 x 0.6 cm

Composition: solid/almost completely solid (2)

Echogenicity: hypoechoic (2)

Shape: not taller-than-wide (0)

Margins: smooth (0)

Echogenic foci: punctate echogenic foci (3)

ACR TI-RADS total points: 7.

ACR TI-RADS risk category: TR5 (>/= 7 points).

ACR TI-RADS recommendations:

*Given size (>/= 0.5 - 0.9 cm) and appearance, a follow-up
ultrasound in 1 year should be considered based on TI-RADS criteria.

Nodule # 2:

Location: Left; Mid

Maximum size: 1.0 cm; Other 2 dimensions: 0.6 x 0.7 cm

Composition: cannot determine (2)

Echogenicity: anechoic (0)

Shape: not taller-than-wide (0)

Margins: smooth (0)

Echogenic foci: punctate echogenic foci (3)

ACR TI-RADS total points: 5.

ACR TI-RADS risk category: TR4 (4-6 points).

ACR TI-RADS recommendations:

*Given size (>/= 1 - 1.4 cm) and appearance, a follow-up ultrasound
in 1 year should be considered based on TI-RADS criteria.

There are several additional punctate anechoic cysts scattered
within the right lobe of the thyroid with dominant cyst within the
inferior pole of the right lobe of the thyroid measuring 1.1 cm in
diameter and contains a punctate internal echogenic foci with ring
down artifact suggestive of colloid. None of these cysts meet
imaging criteria to recommend percutaneous sampling or dedicated
follow-up.
IMPRESSION: 1. Findings suggestive of multinodular goiter.
2. Based on imaging criteria, a 1 year follow-up ultrasound is
recommended to ensure stability of right-sided nodule #1 and
left-sided nodule #2.
The above is in keeping with the ACR TI-RADS recommendations - [HOSPITAL] 7607;[DATE].

## 2018-09-15 DIAGNOSIS — Z803 Family history of malignant neoplasm of breast: Secondary | ICD-10-CM | POA: Diagnosis not present

## 2018-09-15 DIAGNOSIS — Z1231 Encounter for screening mammogram for malignant neoplasm of breast: Secondary | ICD-10-CM | POA: Diagnosis not present

## 2018-09-20 DIAGNOSIS — D225 Melanocytic nevi of trunk: Secondary | ICD-10-CM | POA: Diagnosis not present

## 2018-09-20 DIAGNOSIS — L821 Other seborrheic keratosis: Secondary | ICD-10-CM | POA: Diagnosis not present

## 2018-09-20 DIAGNOSIS — L820 Inflamed seborrheic keratosis: Secondary | ICD-10-CM | POA: Diagnosis not present

## 2018-09-20 DIAGNOSIS — B070 Plantar wart: Secondary | ICD-10-CM | POA: Diagnosis not present

## 2018-09-20 DIAGNOSIS — D0362 Melanoma in situ of left upper limb, including shoulder: Secondary | ICD-10-CM | POA: Diagnosis not present

## 2018-09-21 DIAGNOSIS — E559 Vitamin D deficiency, unspecified: Secondary | ICD-10-CM | POA: Diagnosis not present

## 2018-09-21 DIAGNOSIS — Z0000 Encounter for general adult medical examination without abnormal findings: Secondary | ICD-10-CM | POA: Diagnosis not present

## 2018-09-23 ENCOUNTER — Encounter: Payer: Self-pay | Admitting: Internal Medicine

## 2018-09-28 DIAGNOSIS — F419 Anxiety disorder, unspecified: Secondary | ICD-10-CM | POA: Diagnosis not present

## 2018-09-28 DIAGNOSIS — Z0000 Encounter for general adult medical examination without abnormal findings: Secondary | ICD-10-CM | POA: Diagnosis not present

## 2018-09-28 DIAGNOSIS — K219 Gastro-esophageal reflux disease without esophagitis: Secondary | ICD-10-CM | POA: Diagnosis not present

## 2018-10-13 DIAGNOSIS — D0362 Melanoma in situ of left upper limb, including shoulder: Secondary | ICD-10-CM | POA: Diagnosis not present

## 2018-10-13 DIAGNOSIS — L988 Other specified disorders of the skin and subcutaneous tissue: Secondary | ICD-10-CM | POA: Diagnosis not present

## 2018-11-09 DIAGNOSIS — M1711 Unilateral primary osteoarthritis, right knee: Secondary | ICD-10-CM | POA: Diagnosis not present

## 2018-12-29 DIAGNOSIS — Z08 Encounter for follow-up examination after completed treatment for malignant neoplasm: Secondary | ICD-10-CM | POA: Diagnosis not present

## 2018-12-29 DIAGNOSIS — Z85820 Personal history of malignant melanoma of skin: Secondary | ICD-10-CM | POA: Diagnosis not present

## 2018-12-29 DIAGNOSIS — Z1283 Encounter for screening for malignant neoplasm of skin: Secondary | ICD-10-CM | POA: Diagnosis not present

## 2019-04-18 DIAGNOSIS — Z08 Encounter for follow-up examination after completed treatment for malignant neoplasm: Secondary | ICD-10-CM | POA: Diagnosis not present

## 2019-04-18 DIAGNOSIS — L814 Other melanin hyperpigmentation: Secondary | ICD-10-CM | POA: Diagnosis not present

## 2019-04-18 DIAGNOSIS — L905 Scar conditions and fibrosis of skin: Secondary | ICD-10-CM | POA: Diagnosis not present

## 2019-04-18 DIAGNOSIS — Z85820 Personal history of malignant melanoma of skin: Secondary | ICD-10-CM | POA: Diagnosis not present

## 2019-04-18 DIAGNOSIS — D225 Melanocytic nevi of trunk: Secondary | ICD-10-CM | POA: Diagnosis not present

## 2019-04-18 DIAGNOSIS — L821 Other seborrheic keratosis: Secondary | ICD-10-CM | POA: Diagnosis not present

## 2019-04-18 DIAGNOSIS — D2262 Melanocytic nevi of left upper limb, including shoulder: Secondary | ICD-10-CM | POA: Diagnosis not present

## 2019-04-18 DIAGNOSIS — D485 Neoplasm of uncertain behavior of skin: Secondary | ICD-10-CM | POA: Diagnosis not present

## 2019-09-26 DIAGNOSIS — Z08 Encounter for follow-up examination after completed treatment for malignant neoplasm: Secondary | ICD-10-CM | POA: Diagnosis not present

## 2019-09-26 DIAGNOSIS — L905 Scar conditions and fibrosis of skin: Secondary | ICD-10-CM | POA: Diagnosis not present

## 2019-09-26 DIAGNOSIS — L820 Inflamed seborrheic keratosis: Secondary | ICD-10-CM | POA: Diagnosis not present

## 2019-09-26 DIAGNOSIS — Z86006 Personal history of melanoma in-situ: Secondary | ICD-10-CM | POA: Diagnosis not present

## 2019-09-26 DIAGNOSIS — E785 Hyperlipidemia, unspecified: Secondary | ICD-10-CM | POA: Diagnosis not present

## 2019-09-26 DIAGNOSIS — Z1283 Encounter for screening for malignant neoplasm of skin: Secondary | ICD-10-CM | POA: Diagnosis not present

## 2019-09-26 DIAGNOSIS — R739 Hyperglycemia, unspecified: Secondary | ICD-10-CM | POA: Diagnosis not present

## 2019-09-26 DIAGNOSIS — I10 Essential (primary) hypertension: Secondary | ICD-10-CM | POA: Diagnosis not present

## 2019-09-26 DIAGNOSIS — E559 Vitamin D deficiency, unspecified: Secondary | ICD-10-CM | POA: Diagnosis not present

## 2019-10-03 DIAGNOSIS — F900 Attention-deficit hyperactivity disorder, predominantly inattentive type: Secondary | ICD-10-CM | POA: Diagnosis not present

## 2019-10-03 DIAGNOSIS — F419 Anxiety disorder, unspecified: Secondary | ICD-10-CM | POA: Diagnosis not present

## 2019-10-03 DIAGNOSIS — Z0000 Encounter for general adult medical examination without abnormal findings: Secondary | ICD-10-CM | POA: Diagnosis not present

## 2019-10-04 DIAGNOSIS — Z1231 Encounter for screening mammogram for malignant neoplasm of breast: Secondary | ICD-10-CM | POA: Diagnosis not present

## 2019-10-04 DIAGNOSIS — Z803 Family history of malignant neoplasm of breast: Secondary | ICD-10-CM | POA: Diagnosis not present

## 2019-10-24 DIAGNOSIS — Z20828 Contact with and (suspected) exposure to other viral communicable diseases: Secondary | ICD-10-CM | POA: Diagnosis not present

## 2019-12-19 DIAGNOSIS — B078 Other viral warts: Secondary | ICD-10-CM | POA: Diagnosis not present

## 2019-12-19 DIAGNOSIS — L820 Inflamed seborrheic keratosis: Secondary | ICD-10-CM | POA: Diagnosis not present

## 2019-12-19 DIAGNOSIS — Z86006 Personal history of melanoma in-situ: Secondary | ICD-10-CM | POA: Diagnosis not present

## 2019-12-19 DIAGNOSIS — Z08 Encounter for follow-up examination after completed treatment for malignant neoplasm: Secondary | ICD-10-CM | POA: Diagnosis not present

## 2019-12-19 DIAGNOSIS — Z1283 Encounter for screening for malignant neoplasm of skin: Secondary | ICD-10-CM | POA: Diagnosis not present

## 2019-12-19 DIAGNOSIS — L821 Other seborrheic keratosis: Secondary | ICD-10-CM | POA: Diagnosis not present

## 2020-02-23 DIAGNOSIS — F419 Anxiety disorder, unspecified: Secondary | ICD-10-CM | POA: Diagnosis not present

## 2020-03-05 DIAGNOSIS — H43811 Vitreous degeneration, right eye: Secondary | ICD-10-CM | POA: Diagnosis not present

## 2020-03-05 DIAGNOSIS — H43391 Other vitreous opacities, right eye: Secondary | ICD-10-CM | POA: Diagnosis not present

## 2020-03-05 DIAGNOSIS — H35371 Puckering of macula, right eye: Secondary | ICD-10-CM | POA: Diagnosis not present

## 2020-03-05 DIAGNOSIS — H21341 Primary cyst of pars plana, right eye: Secondary | ICD-10-CM | POA: Diagnosis not present

## 2020-03-19 DIAGNOSIS — H33101 Unspecified retinoschisis, right eye: Secondary | ICD-10-CM | POA: Diagnosis not present

## 2020-03-19 DIAGNOSIS — H35341 Macular cyst, hole, or pseudohole, right eye: Secondary | ICD-10-CM | POA: Diagnosis not present

## 2020-03-19 DIAGNOSIS — H35371 Puckering of macula, right eye: Secondary | ICD-10-CM | POA: Diagnosis not present

## 2020-03-19 DIAGNOSIS — H43811 Vitreous degeneration, right eye: Secondary | ICD-10-CM | POA: Diagnosis not present

## 2020-03-28 DIAGNOSIS — Z08 Encounter for follow-up examination after completed treatment for malignant neoplasm: Secondary | ICD-10-CM | POA: Diagnosis not present

## 2020-03-28 DIAGNOSIS — D225 Melanocytic nevi of trunk: Secondary | ICD-10-CM | POA: Diagnosis not present

## 2020-03-28 DIAGNOSIS — Z1283 Encounter for screening for malignant neoplasm of skin: Secondary | ICD-10-CM | POA: Diagnosis not present

## 2020-03-28 DIAGNOSIS — Z85820 Personal history of malignant melanoma of skin: Secondary | ICD-10-CM | POA: Diagnosis not present

## 2020-05-28 DIAGNOSIS — H21341 Primary cyst of pars plana, right eye: Secondary | ICD-10-CM | POA: Diagnosis not present

## 2020-05-28 DIAGNOSIS — H33311 Horseshoe tear of retina without detachment, right eye: Secondary | ICD-10-CM | POA: Diagnosis not present

## 2020-05-28 DIAGNOSIS — H35371 Puckering of macula, right eye: Secondary | ICD-10-CM | POA: Diagnosis not present

## 2020-05-28 DIAGNOSIS — H43811 Vitreous degeneration, right eye: Secondary | ICD-10-CM | POA: Diagnosis not present

## 2020-05-28 DIAGNOSIS — H33101 Unspecified retinoschisis, right eye: Secondary | ICD-10-CM | POA: Diagnosis not present

## 2020-06-08 DIAGNOSIS — H33311 Horseshoe tear of retina without detachment, right eye: Secondary | ICD-10-CM | POA: Diagnosis not present

## 2020-06-27 DIAGNOSIS — Z20822 Contact with and (suspected) exposure to covid-19: Secondary | ICD-10-CM | POA: Diagnosis not present

## 2020-07-13 DIAGNOSIS — H33322 Round hole, left eye: Secondary | ICD-10-CM | POA: Diagnosis not present

## 2020-07-13 DIAGNOSIS — F419 Anxiety disorder, unspecified: Secondary | ICD-10-CM | POA: Diagnosis not present

## 2020-07-13 DIAGNOSIS — F900 Attention-deficit hyperactivity disorder, predominantly inattentive type: Secondary | ICD-10-CM | POA: Diagnosis not present

## 2020-08-06 DIAGNOSIS — Z85820 Personal history of malignant melanoma of skin: Secondary | ICD-10-CM | POA: Diagnosis not present

## 2020-08-06 DIAGNOSIS — Z1283 Encounter for screening for malignant neoplasm of skin: Secondary | ICD-10-CM | POA: Diagnosis not present

## 2020-08-13 DIAGNOSIS — F900 Attention-deficit hyperactivity disorder, predominantly inattentive type: Secondary | ICD-10-CM | POA: Diagnosis not present

## 2020-08-13 DIAGNOSIS — F419 Anxiety disorder, unspecified: Secondary | ICD-10-CM | POA: Diagnosis not present

## 2020-09-30 DIAGNOSIS — Z20822 Contact with and (suspected) exposure to covid-19: Secondary | ICD-10-CM | POA: Diagnosis not present

## 2020-10-03 ENCOUNTER — Ambulatory Visit: Payer: Self-pay

## 2020-10-03 DIAGNOSIS — J45909 Unspecified asthma, uncomplicated: Secondary | ICD-10-CM | POA: Diagnosis not present

## 2020-10-03 DIAGNOSIS — R059 Cough, unspecified: Secondary | ICD-10-CM | POA: Diagnosis not present

## 2020-10-03 DIAGNOSIS — R062 Wheezing: Secondary | ICD-10-CM | POA: Diagnosis not present

## 2020-10-09 DIAGNOSIS — Z1231 Encounter for screening mammogram for malignant neoplasm of breast: Secondary | ICD-10-CM | POA: Diagnosis not present

## 2020-10-16 DIAGNOSIS — D72829 Elevated white blood cell count, unspecified: Secondary | ICD-10-CM | POA: Diagnosis not present

## 2020-10-23 DIAGNOSIS — E7800 Pure hypercholesterolemia, unspecified: Secondary | ICD-10-CM | POA: Diagnosis not present

## 2020-10-23 DIAGNOSIS — I10 Essential (primary) hypertension: Secondary | ICD-10-CM | POA: Diagnosis not present

## 2020-10-23 DIAGNOSIS — J45909 Unspecified asthma, uncomplicated: Secondary | ICD-10-CM | POA: Diagnosis not present

## 2020-10-23 DIAGNOSIS — R739 Hyperglycemia, unspecified: Secondary | ICD-10-CM | POA: Diagnosis not present

## 2020-10-23 DIAGNOSIS — D72829 Elevated white blood cell count, unspecified: Secondary | ICD-10-CM | POA: Diagnosis not present

## 2020-10-23 DIAGNOSIS — E559 Vitamin D deficiency, unspecified: Secondary | ICD-10-CM | POA: Diagnosis not present

## 2020-10-23 DIAGNOSIS — Z0000 Encounter for general adult medical examination without abnormal findings: Secondary | ICD-10-CM | POA: Diagnosis not present

## 2020-10-31 DIAGNOSIS — E041 Nontoxic single thyroid nodule: Secondary | ICD-10-CM | POA: Diagnosis not present

## 2020-10-31 DIAGNOSIS — Z0000 Encounter for general adult medical examination without abnormal findings: Secondary | ICD-10-CM | POA: Diagnosis not present

## 2020-11-06 ENCOUNTER — Other Ambulatory Visit: Payer: Self-pay | Admitting: Internal Medicine

## 2020-11-06 DIAGNOSIS — Z0000 Encounter for general adult medical examination without abnormal findings: Secondary | ICD-10-CM

## 2020-11-26 ENCOUNTER — Ambulatory Visit
Admission: RE | Admit: 2020-11-26 | Discharge: 2020-11-26 | Disposition: A | Discharge status: Home / Self Care | Payer: BC Managed Care – PPO | Source: Ambulatory Visit | Attending: Internal Medicine | Admitting: Internal Medicine

## 2020-11-26 DIAGNOSIS — L821 Other seborrheic keratosis: Secondary | ICD-10-CM | POA: Diagnosis not present

## 2020-11-26 DIAGNOSIS — Z0000 Encounter for general adult medical examination without abnormal findings: Secondary | ICD-10-CM

## 2020-11-26 DIAGNOSIS — E041 Nontoxic single thyroid nodule: Secondary | ICD-10-CM | POA: Diagnosis not present

## 2020-11-26 DIAGNOSIS — Z08 Encounter for follow-up examination after completed treatment for malignant neoplasm: Secondary | ICD-10-CM | POA: Diagnosis not present

## 2020-11-26 DIAGNOSIS — Z86006 Personal history of melanoma in-situ: Secondary | ICD-10-CM | POA: Diagnosis not present

## 2020-11-26 DIAGNOSIS — Z1283 Encounter for screening for malignant neoplasm of skin: Secondary | ICD-10-CM | POA: Diagnosis not present

## 2021-01-30 DIAGNOSIS — H35371 Puckering of macula, right eye: Secondary | ICD-10-CM | POA: Diagnosis not present

## 2021-01-30 DIAGNOSIS — H43811 Vitreous degeneration, right eye: Secondary | ICD-10-CM | POA: Diagnosis not present

## 2021-01-30 DIAGNOSIS — H31092 Other chorioretinal scars, left eye: Secondary | ICD-10-CM | POA: Diagnosis not present

## 2021-01-30 DIAGNOSIS — H43391 Other vitreous opacities, right eye: Secondary | ICD-10-CM | POA: Diagnosis not present

## 2021-01-30 DIAGNOSIS — H33102 Unspecified retinoschisis, left eye: Secondary | ICD-10-CM | POA: Diagnosis not present

## 2021-05-07 DIAGNOSIS — H5315 Visual distortions of shape and size: Secondary | ICD-10-CM | POA: Diagnosis not present

## 2021-05-07 DIAGNOSIS — H43391 Other vitreous opacities, right eye: Secondary | ICD-10-CM | POA: Diagnosis not present

## 2021-05-07 DIAGNOSIS — H43811 Vitreous degeneration, right eye: Secondary | ICD-10-CM | POA: Diagnosis not present

## 2021-05-07 DIAGNOSIS — H35371 Puckering of macula, right eye: Secondary | ICD-10-CM | POA: Diagnosis not present

## 2021-05-15 DIAGNOSIS — Z86006 Personal history of melanoma in-situ: Secondary | ICD-10-CM | POA: Diagnosis not present

## 2021-05-15 DIAGNOSIS — Z08 Encounter for follow-up examination after completed treatment for malignant neoplasm: Secondary | ICD-10-CM | POA: Diagnosis not present

## 2021-05-15 DIAGNOSIS — Z1283 Encounter for screening for malignant neoplasm of skin: Secondary | ICD-10-CM | POA: Diagnosis not present

## 2021-05-15 DIAGNOSIS — L821 Other seborrheic keratosis: Secondary | ICD-10-CM | POA: Diagnosis not present

## 2021-10-17 DIAGNOSIS — Z1231 Encounter for screening mammogram for malignant neoplasm of breast: Secondary | ICD-10-CM | POA: Diagnosis not present

## 2021-11-01 ENCOUNTER — Ambulatory Visit (HOSPITAL_COMMUNITY): Payer: BC Managed Care – PPO

## 2021-11-05 ENCOUNTER — Other Ambulatory Visit: Payer: Self-pay | Admitting: Registered Nurse

## 2021-11-05 DIAGNOSIS — Z8249 Family history of ischemic heart disease and other diseases of the circulatory system: Secondary | ICD-10-CM

## 2021-11-05 DIAGNOSIS — F900 Attention-deficit hyperactivity disorder, predominantly inattentive type: Secondary | ICD-10-CM | POA: Diagnosis not present

## 2021-11-05 DIAGNOSIS — E7800 Pure hypercholesterolemia, unspecified: Secondary | ICD-10-CM | POA: Diagnosis not present

## 2021-11-05 DIAGNOSIS — E559 Vitamin D deficiency, unspecified: Secondary | ICD-10-CM | POA: Diagnosis not present

## 2021-11-05 DIAGNOSIS — Z0000 Encounter for general adult medical examination without abnormal findings: Secondary | ICD-10-CM | POA: Diagnosis not present

## 2021-11-12 DIAGNOSIS — Z0000 Encounter for general adult medical examination without abnormal findings: Secondary | ICD-10-CM | POA: Diagnosis not present

## 2021-11-12 DIAGNOSIS — E559 Vitamin D deficiency, unspecified: Secondary | ICD-10-CM | POA: Diagnosis not present

## 2021-11-12 DIAGNOSIS — E041 Nontoxic single thyroid nodule: Secondary | ICD-10-CM | POA: Diagnosis not present

## 2021-11-25 DIAGNOSIS — J029 Acute pharyngitis, unspecified: Secondary | ICD-10-CM | POA: Diagnosis not present

## 2021-11-29 ENCOUNTER — Other Ambulatory Visit: Payer: BC Managed Care – PPO

## 2021-12-09 ENCOUNTER — Ambulatory Visit
Admission: RE | Admit: 2021-12-09 | Discharge: 2021-12-09 | Disposition: A | Discharge status: Home / Self Care | Payer: No Typology Code available for payment source | Source: Ambulatory Visit | Attending: Registered Nurse | Admitting: Registered Nurse

## 2021-12-09 ENCOUNTER — Other Ambulatory Visit: Payer: Self-pay

## 2021-12-09 DIAGNOSIS — Z8249 Family history of ischemic heart disease and other diseases of the circulatory system: Secondary | ICD-10-CM

## 2021-12-27 DIAGNOSIS — H43391 Other vitreous opacities, right eye: Secondary | ICD-10-CM | POA: Diagnosis not present

## 2021-12-27 DIAGNOSIS — H35371 Puckering of macula, right eye: Secondary | ICD-10-CM | POA: Diagnosis not present

## 2021-12-27 DIAGNOSIS — H43811 Vitreous degeneration, right eye: Secondary | ICD-10-CM | POA: Diagnosis not present

## 2022-01-01 DIAGNOSIS — Z08 Encounter for follow-up examination after completed treatment for malignant neoplasm: Secondary | ICD-10-CM | POA: Diagnosis not present

## 2022-01-01 DIAGNOSIS — D225 Melanocytic nevi of trunk: Secondary | ICD-10-CM | POA: Diagnosis not present

## 2022-01-01 DIAGNOSIS — Z1283 Encounter for screening for malignant neoplasm of skin: Secondary | ICD-10-CM | POA: Diagnosis not present

## 2022-01-01 DIAGNOSIS — Z85820 Personal history of malignant melanoma of skin: Secondary | ICD-10-CM | POA: Diagnosis not present

## 2022-02-03 DIAGNOSIS — F419 Anxiety disorder, unspecified: Secondary | ICD-10-CM | POA: Diagnosis not present

## 2022-02-03 DIAGNOSIS — F900 Attention-deficit hyperactivity disorder, predominantly inattentive type: Secondary | ICD-10-CM | POA: Diagnosis not present

## 2022-02-08 IMAGING — US US THYROID
1 series · 12 of 25 positions shown · non-contrast
Comparison: 11/28/2016

CLINICAL DATA: Prior ultrasound follow-up. Follow-up thyroid
nodules

EXAM:
THYROID ULTRASOUND
TECHNIQUE: Ultrasound examination of the thyroid gland and adjacent soft
tissues was performed.

[Series 1: us thyroid · 0.08mm/px · 12 of 64 slices shown]
[im 3/64]
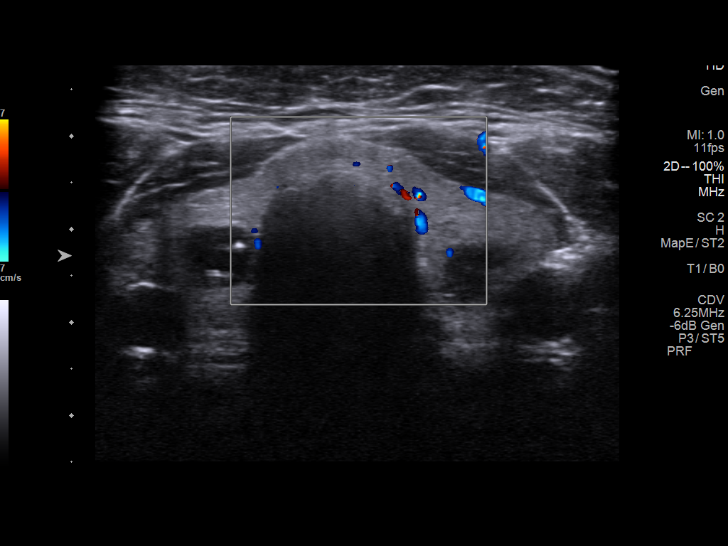
[im 8/64]
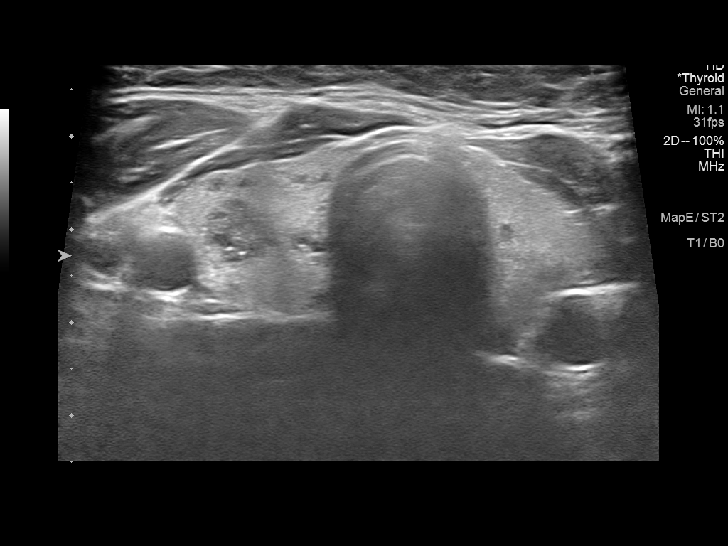
[im 14/64]
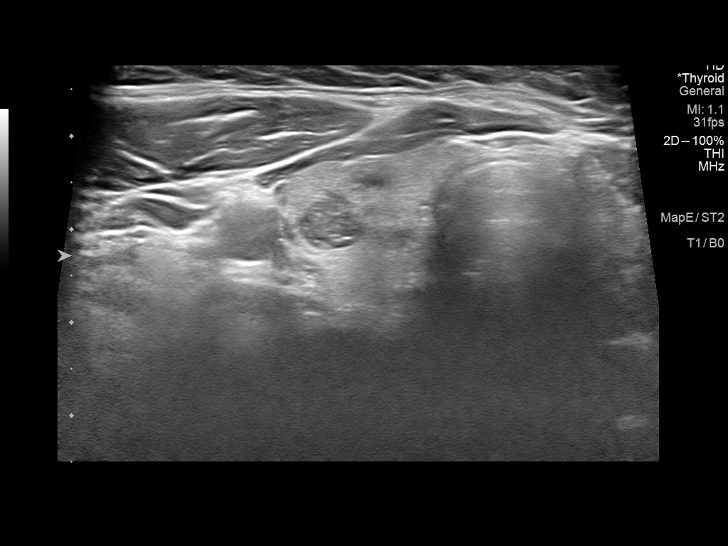
[im 19/64]
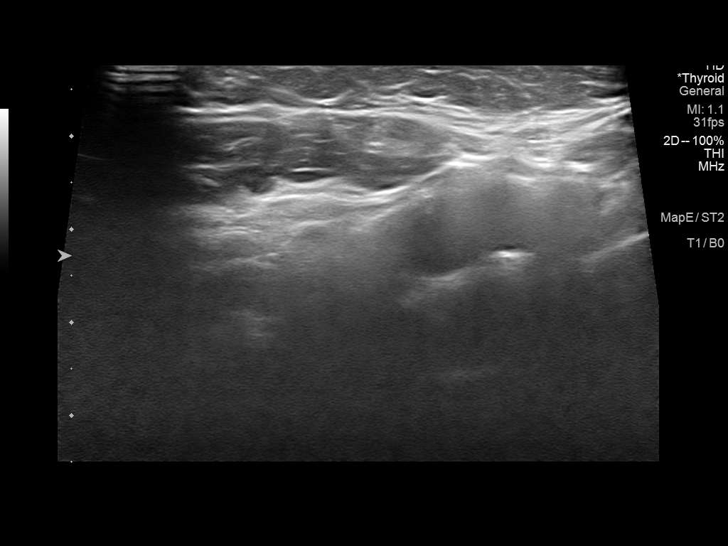
[im 24/64]
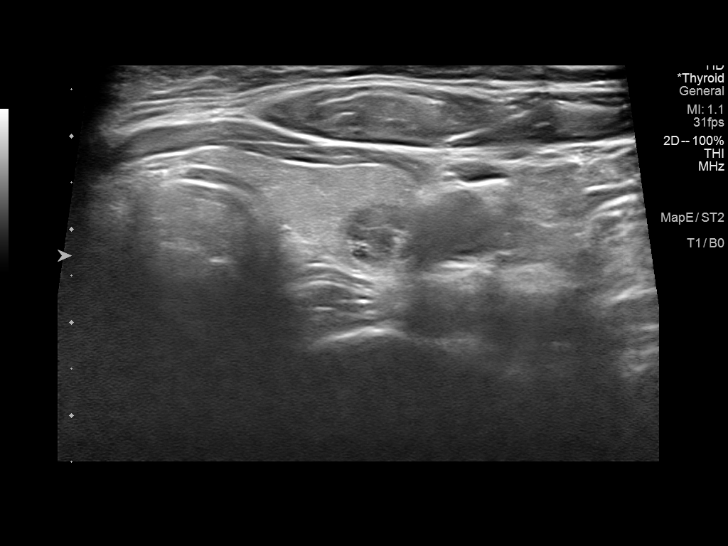
[im 29/64]
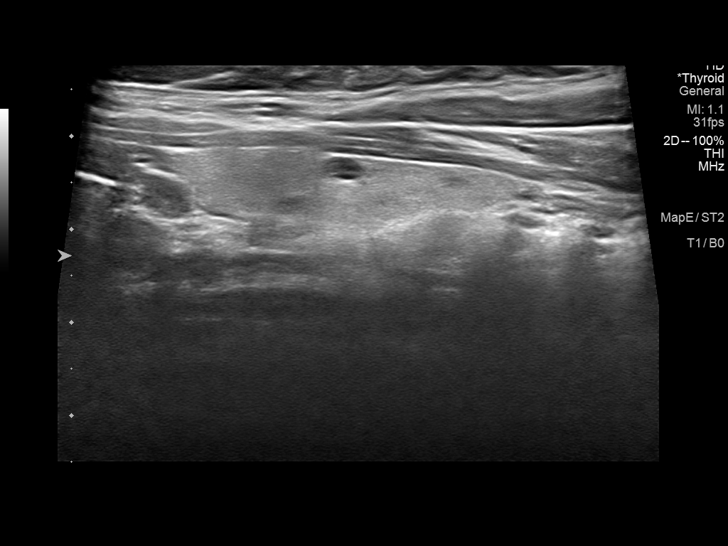
[im 35/64]
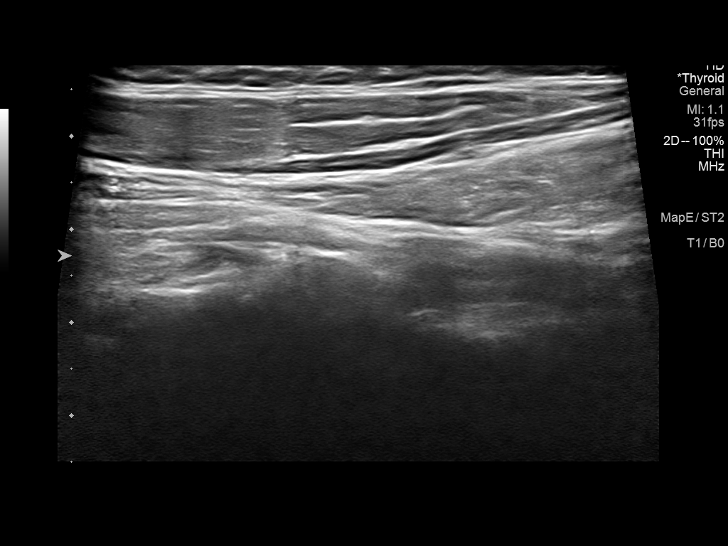
[im 40/64]
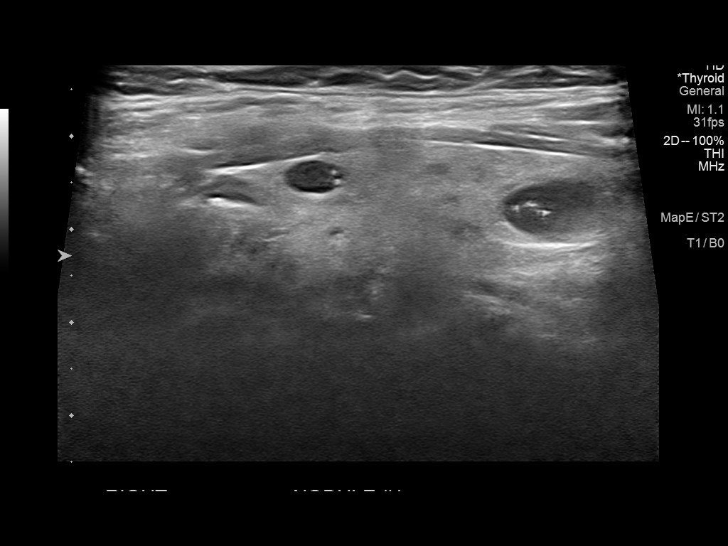
[im 45/64]
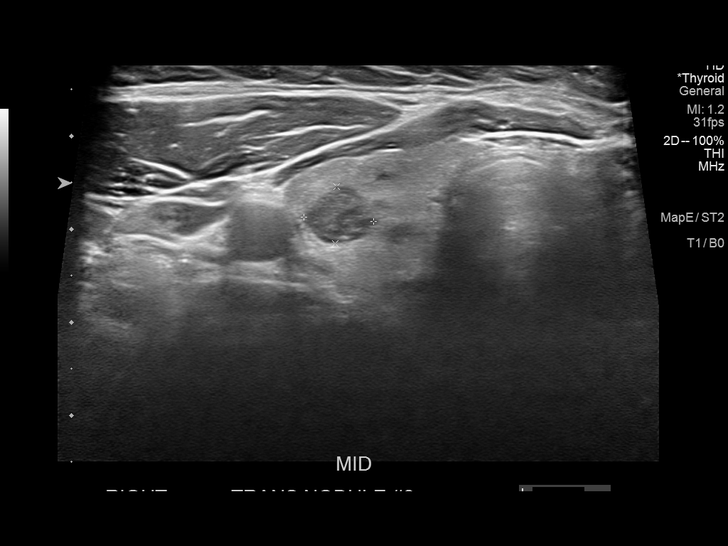
[im 50/64]
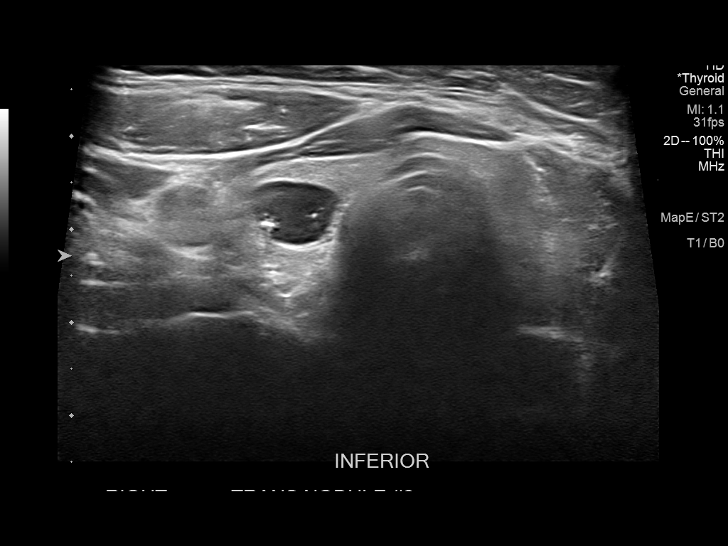
[im 56/64]
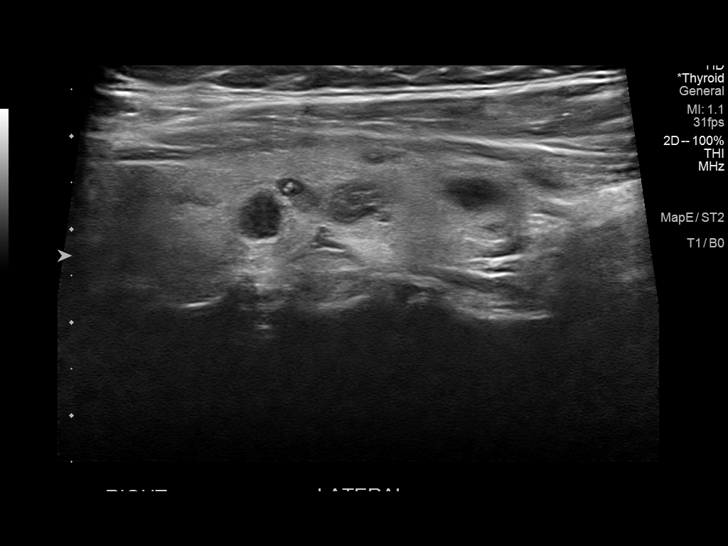
[im 61/64]
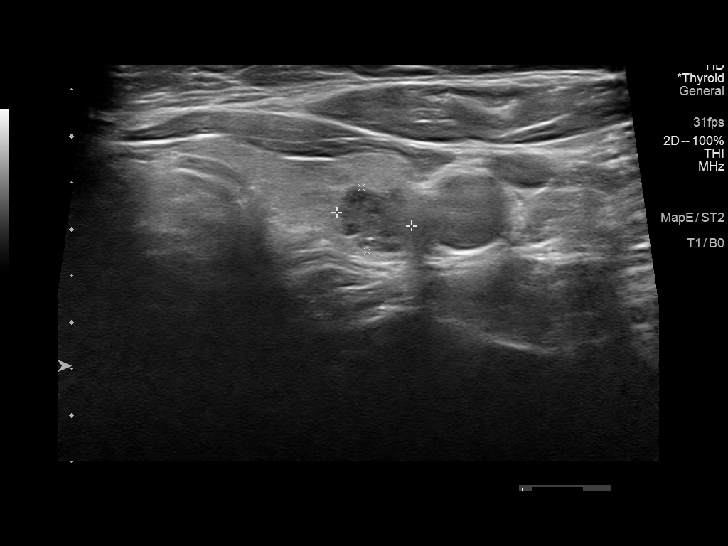

[12 of 25 positions shown; findings below may reference images not displayed]

FINDINGS: Parenchymal Echotexture: Mildly heterogenous

Isthmus: Normal in size measuring 0.3 cm in diameter, unchanged

Right lobe: Normal in size measuring 6.0 x 1.8 x 1.7 cm, previously,
4.5 x 1.5 x 1.6 cm

Left lobe: Normal in size measuring 4.6 x 1 5 x 1.7 cm, previously,
0.1 x 1.6 x 1.6 cm

_________________________________________________________

Estimated total number of nodules >/= 1 cm: 2

Number of spongiform nodules >/=  2 cm not described below (TR1): 0

Number of mixed cystic and solid nodules >/= 1.5 cm not described
below (TR2): 0

_________________________________________________________

Nodule # 1:

Prior biopsy: No

Location: Right; Mid

Maximum size: 0.9 cm; Other 2 dimensions: 0.8 x 0.6 cm, previously,
0.8 x 0.8 x 0.6 cm

Composition: solid/almost completely solid (2)

Echogenicity: hypoechoic (2)

Shape: not taller-than-wide (0)

Margins: smooth (0)

Echogenic foci: punctate echogenic foci (3)

ACR TI-RADS total points: 7.

ACR TI-RADS risk category:  TR5 (>/= 7 points).

Significant change in size (>/= 20% in two dimensions and minimal
increase of 2 mm): No

Change in features: No

Change in ACR TI-RADS risk category: No

ACR TI-RADS recommendations:

*Given size (>/= 0.5 - 0.9 cm) and appearance, a follow-up
ultrasound in 1 year should be considered based on TI-RADS criteria.

_________________________________________________________

Nodule # 2

Prior biopsy: No

Location: Left; Mid

Maximum size: 0.9 cm; Other 2 dimensions: 0.8 x 0.7 cm, previously,
1.0 x 0.7 x 0.6 cm

Composition: solid/almost completely solid (2)

Echogenicity: hypoechoic (2)

Shape: not taller-than-wide (0)

Margins: smooth (0)

Echogenic foci: large comet-tail artifacts (0)

ACR TI-RADS total points: 4.

ACR TI-RADS risk category:  TR4 (4-6 points).

Significant change in size (>/= 20% in two dimensions and minimal
increase of 2 mm): No

Change in features: Yes previously noted echogenic foci now
demonstrate, tail artifact compatible with benign colloid.
Additionally, nodule now appears solid/almost completely solid,
previously predominantly cystic.

Change in ACR TI-RADS risk category: No

ACR TI-RADS recommendations:

Given size (<0.9 cm) and appearance, this nodule does NOT meet
TI-RADS criteria for biopsy or dedicated follow-up.

_________________________________________________________

Multiple punctate (sub 1.2 cm) hypoechoic nodules and anechoic cysts
are seen within the right, and to a lesser extent, the left lobes of
the thyroid with dominant anechoic cyst within the inferior pole the
right lobe of the thyroid measuring approximately 1.2 cm in diameter
and again noted to contain internal echogenic foci compatible with
benign colloid. None of these nodules/cysts, many of which appear
unchanged compared to the 0482 examination, meet imaging criteria to
recommend percutaneous sampling or continued dedicated follow-up.
IMPRESSION: 1. Similar findings of multinodular goiter. No definitive worrisome
new or enlarging thyroid nodules.
2. Nodules #1 and #2 are unchanged compared to the 0482 examination
though again meet imaging criteria to recommend a 1 year follow-up.
and thus a benign etiology.

The above is in keeping with the ACR TI-RADS recommendations - [HOSPITAL] 6017;[DATE].

## 2022-03-24 DIAGNOSIS — Z1272 Encounter for screening for malignant neoplasm of vagina: Secondary | ICD-10-CM | POA: Diagnosis not present

## 2022-03-24 DIAGNOSIS — N760 Acute vaginitis: Secondary | ICD-10-CM | POA: Diagnosis not present

## 2022-03-24 DIAGNOSIS — N959 Unspecified menopausal and perimenopausal disorder: Secondary | ICD-10-CM | POA: Diagnosis not present

## 2022-03-24 DIAGNOSIS — Z1151 Encounter for screening for human papillomavirus (HPV): Secondary | ICD-10-CM | POA: Diagnosis not present

## 2022-03-24 DIAGNOSIS — Z01419 Encounter for gynecological examination (general) (routine) without abnormal findings: Secondary | ICD-10-CM | POA: Diagnosis not present

## 2022-03-24 DIAGNOSIS — Z6834 Body mass index (BMI) 34.0-34.9, adult: Secondary | ICD-10-CM | POA: Diagnosis not present

## 2022-05-26 DIAGNOSIS — F900 Attention-deficit hyperactivity disorder, predominantly inattentive type: Secondary | ICD-10-CM | POA: Diagnosis not present

## 2022-05-26 DIAGNOSIS — F419 Anxiety disorder, unspecified: Secondary | ICD-10-CM | POA: Diagnosis not present

## 2022-06-06 DIAGNOSIS — J209 Acute bronchitis, unspecified: Secondary | ICD-10-CM | POA: Diagnosis not present

## 2022-06-06 DIAGNOSIS — R059 Cough, unspecified: Secondary | ICD-10-CM | POA: Diagnosis not present

## 2022-06-06 DIAGNOSIS — Z6834 Body mass index (BMI) 34.0-34.9, adult: Secondary | ICD-10-CM | POA: Diagnosis not present

## 2022-06-06 DIAGNOSIS — J0140 Acute pansinusitis, unspecified: Secondary | ICD-10-CM | POA: Diagnosis not present

## 2022-06-23 DIAGNOSIS — L821 Other seborrheic keratosis: Secondary | ICD-10-CM | POA: Diagnosis not present

## 2022-06-23 DIAGNOSIS — Z86006 Personal history of melanoma in-situ: Secondary | ICD-10-CM | POA: Diagnosis not present

## 2022-06-23 DIAGNOSIS — C44319 Basal cell carcinoma of skin of other parts of face: Secondary | ICD-10-CM | POA: Diagnosis not present

## 2022-06-23 DIAGNOSIS — Z1283 Encounter for screening for malignant neoplasm of skin: Secondary | ICD-10-CM | POA: Diagnosis not present

## 2022-06-23 DIAGNOSIS — Z08 Encounter for follow-up examination after completed treatment for malignant neoplasm: Secondary | ICD-10-CM | POA: Diagnosis not present

## 2022-07-18 DIAGNOSIS — K08409 Partial loss of teeth, unspecified cause, unspecified class: Secondary | ICD-10-CM | POA: Diagnosis not present

## 2022-07-18 DIAGNOSIS — Z1384 Encounter for screening for dental disorders: Secondary | ICD-10-CM | POA: Diagnosis not present

## 2022-07-18 DIAGNOSIS — K0825 Moderate atrophy of the maxilla: Secondary | ICD-10-CM | POA: Diagnosis not present

## 2022-07-18 DIAGNOSIS — R9389 Abnormal findings on diagnostic imaging of other specified body structures: Secondary | ICD-10-CM | POA: Diagnosis not present

## 2022-07-28 DIAGNOSIS — H43811 Vitreous degeneration, right eye: Secondary | ICD-10-CM | POA: Diagnosis not present

## 2022-07-28 DIAGNOSIS — H35371 Puckering of macula, right eye: Secondary | ICD-10-CM | POA: Diagnosis not present

## 2022-07-28 DIAGNOSIS — H31093 Other chorioretinal scars, bilateral: Secondary | ICD-10-CM | POA: Diagnosis not present

## 2022-07-28 DIAGNOSIS — H25813 Combined forms of age-related cataract, bilateral: Secondary | ICD-10-CM | POA: Diagnosis not present

## 2022-08-27 DIAGNOSIS — Z85828 Personal history of other malignant neoplasm of skin: Secondary | ICD-10-CM | POA: Diagnosis not present

## 2022-08-27 DIAGNOSIS — Z08 Encounter for follow-up examination after completed treatment for malignant neoplasm: Secondary | ICD-10-CM | POA: Diagnosis not present

## 2022-10-14 DIAGNOSIS — Z20828 Contact with and (suspected) exposure to other viral communicable diseases: Secondary | ICD-10-CM | POA: Diagnosis not present

## 2022-10-14 DIAGNOSIS — J209 Acute bronchitis, unspecified: Secondary | ICD-10-CM | POA: Diagnosis not present

## 2022-10-14 DIAGNOSIS — Z03818 Encounter for observation for suspected exposure to other biological agents ruled out: Secondary | ICD-10-CM | POA: Diagnosis not present

## 2022-10-14 DIAGNOSIS — Z20822 Contact with and (suspected) exposure to covid-19: Secondary | ICD-10-CM | POA: Diagnosis not present

## 2022-10-14 DIAGNOSIS — R051 Acute cough: Secondary | ICD-10-CM | POA: Diagnosis not present

## 2022-10-21 DIAGNOSIS — F339 Major depressive disorder, recurrent, unspecified: Secondary | ICD-10-CM | POA: Diagnosis not present

## 2022-10-21 DIAGNOSIS — J45909 Unspecified asthma, uncomplicated: Secondary | ICD-10-CM | POA: Diagnosis not present

## 2022-10-30 DIAGNOSIS — Z1231 Encounter for screening mammogram for malignant neoplasm of breast: Secondary | ICD-10-CM | POA: Diagnosis not present

## 2022-11-24 DIAGNOSIS — J45909 Unspecified asthma, uncomplicated: Secondary | ICD-10-CM | POA: Diagnosis not present

## 2022-11-24 DIAGNOSIS — F339 Major depressive disorder, recurrent, unspecified: Secondary | ICD-10-CM | POA: Diagnosis not present

## 2022-12-31 DIAGNOSIS — E041 Nontoxic single thyroid nodule: Secondary | ICD-10-CM | POA: Diagnosis not present

## 2022-12-31 DIAGNOSIS — E559 Vitamin D deficiency, unspecified: Secondary | ICD-10-CM | POA: Diagnosis not present

## 2022-12-31 DIAGNOSIS — Z0000 Encounter for general adult medical examination without abnormal findings: Secondary | ICD-10-CM | POA: Diagnosis not present

## 2022-12-31 DIAGNOSIS — E7800 Pure hypercholesterolemia, unspecified: Secondary | ICD-10-CM | POA: Diagnosis not present

## 2023-01-05 DIAGNOSIS — D225 Melanocytic nevi of trunk: Secondary | ICD-10-CM | POA: Diagnosis not present

## 2023-01-05 DIAGNOSIS — Z08 Encounter for follow-up examination after completed treatment for malignant neoplasm: Secondary | ICD-10-CM | POA: Diagnosis not present

## 2023-01-05 DIAGNOSIS — Z85820 Personal history of malignant melanoma of skin: Secondary | ICD-10-CM | POA: Diagnosis not present

## 2023-01-05 DIAGNOSIS — Z1283 Encounter for screening for malignant neoplasm of skin: Secondary | ICD-10-CM | POA: Diagnosis not present

## 2023-01-07 ENCOUNTER — Other Ambulatory Visit: Payer: Self-pay | Admitting: Internal Medicine

## 2023-01-07 DIAGNOSIS — E041 Nontoxic single thyroid nodule: Secondary | ICD-10-CM | POA: Diagnosis not present

## 2023-01-07 DIAGNOSIS — E785 Hyperlipidemia, unspecified: Secondary | ICD-10-CM | POA: Diagnosis not present

## 2023-01-07 DIAGNOSIS — E559 Vitamin D deficiency, unspecified: Secondary | ICD-10-CM | POA: Diagnosis not present

## 2023-01-07 DIAGNOSIS — Z23 Encounter for immunization: Secondary | ICD-10-CM | POA: Diagnosis not present

## 2023-01-07 DIAGNOSIS — K219 Gastro-esophageal reflux disease without esophagitis: Secondary | ICD-10-CM | POA: Diagnosis not present

## 2023-01-07 DIAGNOSIS — Z0000 Encounter for general adult medical examination without abnormal findings: Secondary | ICD-10-CM | POA: Diagnosis not present

## 2023-01-29 ENCOUNTER — Ambulatory Visit
Admission: RE | Admit: 2023-01-29 | Discharge: 2023-01-29 | Disposition: A | Discharge status: Home / Self Care | Payer: BC Managed Care – PPO | Source: Ambulatory Visit | Attending: Internal Medicine | Admitting: Internal Medicine

## 2023-01-29 DIAGNOSIS — E041 Nontoxic single thyroid nodule: Secondary | ICD-10-CM

## 2023-01-29 DIAGNOSIS — E042 Nontoxic multinodular goiter: Secondary | ICD-10-CM | POA: Diagnosis not present

## 2023-03-05 DIAGNOSIS — Z13820 Encounter for screening for osteoporosis: Secondary | ICD-10-CM | POA: Diagnosis not present

## 2023-04-09 DIAGNOSIS — E559 Vitamin D deficiency, unspecified: Secondary | ICD-10-CM | POA: Diagnosis not present

## 2023-05-13 DIAGNOSIS — M542 Cervicalgia: Secondary | ICD-10-CM | POA: Diagnosis not present

## 2023-05-13 DIAGNOSIS — Z6833 Body mass index (BMI) 33.0-33.9, adult: Secondary | ICD-10-CM | POA: Diagnosis not present

## 2023-05-13 DIAGNOSIS — Z01419 Encounter for gynecological examination (general) (routine) without abnormal findings: Secondary | ICD-10-CM | POA: Diagnosis not present

## 2023-06-09 DIAGNOSIS — M542 Cervicalgia: Secondary | ICD-10-CM | POA: Diagnosis not present

## 2023-07-27 DIAGNOSIS — H43811 Vitreous degeneration, right eye: Secondary | ICD-10-CM | POA: Diagnosis not present

## 2023-07-27 DIAGNOSIS — H33102 Unspecified retinoschisis, left eye: Secondary | ICD-10-CM | POA: Diagnosis not present

## 2023-07-27 DIAGNOSIS — H35371 Puckering of macula, right eye: Secondary | ICD-10-CM | POA: Diagnosis not present

## 2023-07-27 DIAGNOSIS — H43391 Other vitreous opacities, right eye: Secondary | ICD-10-CM | POA: Diagnosis not present

## 2023-09-17 DIAGNOSIS — J4521 Mild intermittent asthma with (acute) exacerbation: Secondary | ICD-10-CM | POA: Diagnosis not present

## 2023-09-17 DIAGNOSIS — R053 Chronic cough: Secondary | ICD-10-CM | POA: Diagnosis not present

## 2023-11-02 DIAGNOSIS — Z1231 Encounter for screening mammogram for malignant neoplasm of breast: Secondary | ICD-10-CM | POA: Diagnosis not present

## 2023-11-30 DIAGNOSIS — M25562 Pain in left knee: Secondary | ICD-10-CM | POA: Diagnosis not present

## 2023-12-16 DIAGNOSIS — M25562 Pain in left knee: Secondary | ICD-10-CM | POA: Diagnosis not present

## 2024-01-05 DIAGNOSIS — E041 Nontoxic single thyroid nodule: Secondary | ICD-10-CM | POA: Diagnosis not present

## 2024-01-05 DIAGNOSIS — Z0000 Encounter for general adult medical examination without abnormal findings: Secondary | ICD-10-CM | POA: Diagnosis not present

## 2024-01-05 DIAGNOSIS — R739 Hyperglycemia, unspecified: Secondary | ICD-10-CM | POA: Diagnosis not present

## 2024-01-18 DIAGNOSIS — F339 Major depressive disorder, recurrent, unspecified: Secondary | ICD-10-CM | POA: Diagnosis not present

## 2024-01-18 DIAGNOSIS — J4521 Mild intermittent asthma with (acute) exacerbation: Secondary | ICD-10-CM | POA: Diagnosis not present

## 2024-01-18 DIAGNOSIS — E785 Hyperlipidemia, unspecified: Secondary | ICD-10-CM | POA: Diagnosis not present

## 2024-01-18 DIAGNOSIS — Z0000 Encounter for general adult medical examination without abnormal findings: Secondary | ICD-10-CM | POA: Diagnosis not present

## 2024-01-18 DIAGNOSIS — K219 Gastro-esophageal reflux disease without esophagitis: Secondary | ICD-10-CM | POA: Diagnosis not present

## 2024-05-24 DIAGNOSIS — E785 Hyperlipidemia, unspecified: Secondary | ICD-10-CM | POA: Diagnosis not present

## 2024-05-24 DIAGNOSIS — E6609 Other obesity due to excess calories: Secondary | ICD-10-CM | POA: Diagnosis not present

## 2024-05-24 DIAGNOSIS — Z6835 Body mass index (BMI) 35.0-35.9, adult: Secondary | ICD-10-CM | POA: Diagnosis not present

## 2024-07-11 DIAGNOSIS — Z01419 Encounter for gynecological examination (general) (routine) without abnormal findings: Secondary | ICD-10-CM | POA: Diagnosis not present

## 2024-07-11 DIAGNOSIS — Z6831 Body mass index (BMI) 31.0-31.9, adult: Secondary | ICD-10-CM | POA: Diagnosis not present

## 2024-08-08 DIAGNOSIS — H43391 Other vitreous opacities, right eye: Secondary | ICD-10-CM | POA: Diagnosis not present

## 2024-08-08 DIAGNOSIS — H2513 Age-related nuclear cataract, bilateral: Secondary | ICD-10-CM | POA: Diagnosis not present

## 2024-08-08 DIAGNOSIS — H35371 Puckering of macula, right eye: Secondary | ICD-10-CM | POA: Diagnosis not present

## 2024-08-08 DIAGNOSIS — H59813 Chorioretinal scars after surgery for detachment, bilateral: Secondary | ICD-10-CM | POA: Diagnosis not present

## 2024-08-23 DIAGNOSIS — E6609 Other obesity due to excess calories: Secondary | ICD-10-CM | POA: Diagnosis not present

## 2024-08-23 DIAGNOSIS — E785 Hyperlipidemia, unspecified: Secondary | ICD-10-CM | POA: Diagnosis not present

## 2024-09-09 DIAGNOSIS — Z1231 Encounter for screening mammogram for malignant neoplasm of breast: Secondary | ICD-10-CM | POA: Diagnosis not present
